# Patient Record
Sex: Male | Born: 1960 | Race: White | Hispanic: No | Marital: Married | Smoking: Current every day smoker
Health system: Southern US, Community
[De-identification: ages and names within clinical notes are randomized; demographics above are authoritative.]

## PROBLEM LIST (undated history)

## (undated) DIAGNOSIS — E785 Hyperlipidemia, unspecified: Secondary | ICD-10-CM

## (undated) DIAGNOSIS — H269 Unspecified cataract: Secondary | ICD-10-CM

## (undated) DIAGNOSIS — R519 Headache, unspecified: Secondary | ICD-10-CM

## (undated) DIAGNOSIS — G4730 Sleep apnea, unspecified: Secondary | ICD-10-CM

## (undated) DIAGNOSIS — F419 Anxiety disorder, unspecified: Secondary | ICD-10-CM

## (undated) DIAGNOSIS — K5790 Diverticulosis of intestine, part unspecified, without perforation or abscess without bleeding: Secondary | ICD-10-CM

## (undated) DIAGNOSIS — M1990 Unspecified osteoarthritis, unspecified site: Secondary | ICD-10-CM

## (undated) DIAGNOSIS — D126 Benign neoplasm of colon, unspecified: Secondary | ICD-10-CM

## (undated) HISTORY — PX: OTHER SURGICAL HISTORY: SHX169

## (undated) HISTORY — DX: Unspecified osteoarthritis, unspecified site: M19.90

## (undated) HISTORY — DX: Hyperlipidemia, unspecified: E78.5

## (undated) HISTORY — DX: Sleep apnea, unspecified: G47.30

## (undated) HISTORY — PX: JOINT REPLACEMENT: SHX530

## (undated) HISTORY — PX: WISDOM TOOTH EXTRACTION: SHX21

## (undated) HISTORY — PX: TYMPANOSTOMY: SHX2586

## (undated) HISTORY — PX: CATARACT EXTRACTION: SUR2

## (undated) HISTORY — PX: KNEE ARTHROSCOPY: SUR90

## (undated) HISTORY — PX: COLONOSCOPY: SHX174

## (undated) HISTORY — PX: SALIVARY GLAND SURGERY: SHX768

## (undated) HISTORY — DX: Benign neoplasm of colon, unspecified: D12.6

## (undated) HISTORY — PX: ADENOIDECTOMY: SUR15

## (undated) HISTORY — PX: NASAL SEPTUM SURGERY: SHX37

## (undated) HISTORY — DX: Diverticulosis of intestine, part unspecified, without perforation or abscess without bleeding: K57.90

## (undated) HISTORY — DX: Headache, unspecified: R51.9

## (undated) HISTORY — DX: Unspecified cataract: H26.9

## (undated) HISTORY — PX: MOUTH SURGERY: SHX715

## (undated) HISTORY — PX: ANKLE ARTHROSCOPY: SUR85

## (undated) HISTORY — PX: TONSILLECTOMY: SUR1361

---

## 1998-11-08 ENCOUNTER — Emergency Department (HOSPITAL_COMMUNITY): Admission: EM | Admit: 1998-11-08 | Discharge: 1998-11-09 | Payer: Self-pay | Admitting: Emergency Medicine

## 1999-01-02 ENCOUNTER — Encounter: Payer: Self-pay | Admitting: Surgery

## 1999-01-02 ENCOUNTER — Inpatient Hospital Stay (HOSPITAL_COMMUNITY): Admission: EM | Admit: 1999-01-02 | Discharge: 1999-01-06 | Payer: Self-pay | Admitting: *Deleted

## 1999-01-04 ENCOUNTER — Encounter: Payer: Self-pay | Admitting: Surgery

## 1999-01-31 ENCOUNTER — Encounter: Payer: Self-pay | Admitting: Surgery

## 1999-01-31 ENCOUNTER — Ambulatory Visit (HOSPITAL_COMMUNITY): Admission: RE | Admit: 1999-01-31 | Discharge: 1999-01-31 | Payer: Self-pay | Admitting: Surgery

## 2002-10-18 ENCOUNTER — Emergency Department (HOSPITAL_COMMUNITY): Admission: EM | Admit: 2002-10-18 | Discharge: 2002-10-18 | Payer: Self-pay | Admitting: Emergency Medicine

## 2002-12-10 ENCOUNTER — Encounter: Admission: RE | Admit: 2002-12-10 | Discharge: 2002-12-10 | Payer: Self-pay | Admitting: Internal Medicine

## 2002-12-30 ENCOUNTER — Encounter: Admission: RE | Admit: 2002-12-30 | Discharge: 2002-12-30 | Payer: Self-pay | Admitting: Internal Medicine

## 2003-03-10 ENCOUNTER — Encounter: Admission: RE | Admit: 2003-03-10 | Discharge: 2003-06-08 | Payer: Self-pay | Admitting: Family Medicine

## 2003-05-11 ENCOUNTER — Other Ambulatory Visit (HOSPITAL_COMMUNITY): Admission: RE | Admit: 2003-05-11 | Discharge: 2003-05-24 | Payer: Self-pay | Admitting: Psychiatry

## 2003-06-10 ENCOUNTER — Inpatient Hospital Stay (HOSPITAL_COMMUNITY): Admission: EM | Admit: 2003-06-10 | Discharge: 2003-06-15 | Payer: Self-pay | Admitting: Psychiatry

## 2009-05-01 ENCOUNTER — Inpatient Hospital Stay (HOSPITAL_COMMUNITY): Admission: EM | Admit: 2009-05-01 | Discharge: 2009-05-01 | Payer: Self-pay | Admitting: Emergency Medicine

## 2009-05-08 ENCOUNTER — Emergency Department (HOSPITAL_COMMUNITY): Admission: EM | Admit: 2009-05-08 | Discharge: 2009-05-08 | Payer: Self-pay | Admitting: Emergency Medicine

## 2010-09-13 LAB — POCT I-STAT, CHEM 8
BUN: 9 mg/dL (ref 6–23)
Calcium, Ion: 1.13 mmol/L (ref 1.12–1.32)
Chloride: 104 mEq/L (ref 96–112)
Creatinine, Ser: 0.9 mg/dL (ref 0.4–1.5)
Glucose, Bld: 109 mg/dL — ABNORMAL HIGH (ref 70–99)
HCT: 46 % (ref 39.0–52.0)
Hemoglobin: 15.6 g/dL (ref 13.0–17.0)
Potassium: 3.6 mEq/L (ref 3.5–5.1)
Sodium: 134 mEq/L — ABNORMAL LOW (ref 135–145)
TCO2: 26 mmol/L (ref 0–100)

## 2010-09-13 LAB — CBC
HCT: 38.7 % — ABNORMAL LOW (ref 39.0–52.0)
HCT: 43 % (ref 39.0–52.0)
Hemoglobin: 13.4 g/dL (ref 13.0–17.0)
Hemoglobin: 14.6 g/dL (ref 13.0–17.0)
MCHC: 34 g/dL (ref 30.0–36.0)
MCHC: 34.7 g/dL (ref 30.0–36.0)
MCV: 90.9 fL (ref 78.0–100.0)
MCV: 92.4 fL (ref 78.0–100.0)
Platelets: 134 10*3/uL — ABNORMAL LOW (ref 150–400)
Platelets: 176 10*3/uL (ref 150–400)
RBC: 4.25 MIL/uL (ref 4.22–5.81)
RBC: 4.65 MIL/uL (ref 4.22–5.81)
RDW: 13.2 % (ref 11.5–15.5)
RDW: 13.3 % (ref 11.5–15.5)
WBC: 14.6 10*3/uL — ABNORMAL HIGH (ref 4.0–10.5)
WBC: 7.6 10*3/uL (ref 4.0–10.5)

## 2010-09-13 LAB — DIFFERENTIAL
Basophils Absolute: 0 10*3/uL (ref 0.0–0.1)
Basophils Absolute: 0 10*3/uL (ref 0.0–0.1)
Basophils Relative: 0 % (ref 0–1)
Basophils Relative: 0 % (ref 0–1)
Eosinophils Absolute: 0.2 10*3/uL (ref 0.0–0.7)
Eosinophils Absolute: 0.3 10*3/uL (ref 0.0–0.7)
Eosinophils Relative: 1 % (ref 0–5)
Eosinophils Relative: 4 % (ref 0–5)
Lymphocytes Relative: 12 % (ref 12–46)
Lymphocytes Relative: 29 % (ref 12–46)
Lymphs Abs: 1.7 10*3/uL (ref 0.7–4.0)
Lymphs Abs: 2.2 10*3/uL (ref 0.7–4.0)
Monocytes Absolute: 0.8 10*3/uL (ref 0.1–1.0)
Monocytes Absolute: 0.8 10*3/uL (ref 0.1–1.0)
Monocytes Relative: 10 % (ref 3–12)
Monocytes Relative: 5 % (ref 3–12)
Neutro Abs: 11.9 10*3/uL — ABNORMAL HIGH (ref 1.7–7.7)
Neutro Abs: 4.2 10*3/uL (ref 1.7–7.7)
Neutrophils Relative %: 55 % (ref 43–77)
Neutrophils Relative %: 82 % — ABNORMAL HIGH (ref 43–77)
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
## 2014-11-01 ENCOUNTER — Other Ambulatory Visit: Payer: Self-pay | Admitting: Family Medicine

## 2014-11-01 DIAGNOSIS — R7989 Other specified abnormal findings of blood chemistry: Secondary | ICD-10-CM

## 2014-11-01 DIAGNOSIS — M5412 Radiculopathy, cervical region: Secondary | ICD-10-CM

## 2014-11-03 ENCOUNTER — Ambulatory Visit
Admission: RE | Admit: 2014-11-03 | Discharge: 2014-11-03 | Disposition: A | Discharge status: Home / Self Care | Payer: 59 | Source: Ambulatory Visit | Attending: Family Medicine | Admitting: Family Medicine

## 2014-11-03 DIAGNOSIS — R7989 Other specified abnormal findings of blood chemistry: Secondary | ICD-10-CM

## 2014-11-03 DIAGNOSIS — M5412 Radiculopathy, cervical region: Secondary | ICD-10-CM

## 2014-11-09 ENCOUNTER — Other Ambulatory Visit: Payer: Self-pay | Admitting: Orthopedic Surgery

## 2014-11-09 DIAGNOSIS — M25511 Pain in right shoulder: Secondary | ICD-10-CM

## 2014-11-22 ENCOUNTER — Other Ambulatory Visit: Payer: 59

## 2015-03-08 ENCOUNTER — Other Ambulatory Visit: Payer: Self-pay | Admitting: Neurosurgery

## 2015-03-08 DIAGNOSIS — M5412 Radiculopathy, cervical region: Secondary | ICD-10-CM

## 2015-03-21 ENCOUNTER — Ambulatory Visit
Admission: RE | Admit: 2015-03-21 | Discharge: 2015-03-21 | Disposition: A | Discharge status: Home / Self Care | Payer: 59 | Source: Ambulatory Visit | Attending: Neurosurgery | Admitting: Neurosurgery

## 2015-03-21 DIAGNOSIS — M5412 Radiculopathy, cervical region: Secondary | ICD-10-CM

## 2015-03-21 MED ORDER — IOHEXOL 300 MG/ML  SOLN
10.0000 mL | Freq: Once | INTRAMUSCULAR | Status: DC | PRN
  Administered 2015-03-21: 10 mL via INTRATHECAL

## 2015-03-21 MED ORDER — DIAZEPAM 5 MG PO TABS
10.0000 mg | ORAL_TABLET | Freq: Once | ORAL | Status: AC
  Administered 2015-03-21: 10 mg via ORAL

## 2015-03-21 NOTE — Discharge Instructions (Signed)

## 2015-03-21 NOTE — Progress Notes (Signed)
Patient states he has been off Lexapro for at least the past two days.  Vannessa Godown, RN 

## 2015-04-11 ENCOUNTER — Other Ambulatory Visit: Payer: Self-pay | Admitting: Neurosurgery

## 2015-04-11 DIAGNOSIS — M5412 Radiculopathy, cervical region: Secondary | ICD-10-CM

## 2015-04-18 ENCOUNTER — Ambulatory Visit
Admission: RE | Admit: 2015-04-18 | Discharge: 2015-04-18 | Disposition: A | Discharge status: Home / Self Care | Payer: 59 | Source: Ambulatory Visit | Attending: Neurosurgery | Admitting: Neurosurgery

## 2015-04-18 DIAGNOSIS — M5412 Radiculopathy, cervical region: Secondary | ICD-10-CM

## 2015-04-18 MED ORDER — TRIAMCINOLONE ACETONIDE 40 MG/ML IJ SUSP (RADIOLOGY)
60.0000 mg | Freq: Once | INTRAMUSCULAR | Status: AC
  Administered 2015-04-18: 60 mg via EPIDURAL

## 2015-04-18 MED ORDER — IOHEXOL 300 MG/ML  SOLN
1.0000 mL | Freq: Once | INTRAMUSCULAR | Status: DC | PRN
  Administered 2015-04-18: 1 mL via EPIDURAL

## 2015-04-18 NOTE — Discharge Instructions (Signed)

## 2015-04-27 ENCOUNTER — Other Ambulatory Visit (HOSPITAL_COMMUNITY): Payer: Self-pay | Admitting: Neurosurgery

## 2015-05-10 ENCOUNTER — Encounter: Payer: Self-pay | Admitting: Internal Medicine

## 2015-06-09 ENCOUNTER — Ambulatory Visit: Admit: 2015-06-09 | Payer: Self-pay | Admitting: Neurosurgery

## 2015-06-09 SURGERY — ANTERIOR CERVICAL DECOMPRESSION/DISCECTOMY FUSION 1 LEVEL
Anesthesia: General

## 2015-07-01 ENCOUNTER — Ambulatory Visit (AMBULATORY_SURGERY_CENTER): Payer: Self-pay | Admitting: *Deleted

## 2015-07-01 VITALS — Ht 66.5 in | Wt 222.6 lb

## 2015-07-01 DIAGNOSIS — Z1211 Encounter for screening for malignant neoplasm of colon: Secondary | ICD-10-CM

## 2015-07-01 NOTE — Progress Notes (Signed)
No egg or soy allergy known to patient  No issues with past sedation with any surgeries  or procedures, no intubation problems  No diet pills per patient  No home 02 use per patient   Pt declined emmi

## 2015-07-15 ENCOUNTER — Encounter: Payer: Self-pay | Admitting: Internal Medicine

## 2015-07-15 ENCOUNTER — Ambulatory Visit (AMBULATORY_SURGERY_CENTER): Payer: BLUE CROSS/BLUE SHIELD | Admitting: Internal Medicine

## 2015-07-15 VITALS — BP 146/95 | HR 72 | Temp 96.1°F | Resp 18 | Ht 66.0 in | Wt 222.0 lb

## 2015-07-15 DIAGNOSIS — D123 Benign neoplasm of transverse colon: Secondary | ICD-10-CM | POA: Diagnosis not present

## 2015-07-15 DIAGNOSIS — D125 Benign neoplasm of sigmoid colon: Secondary | ICD-10-CM

## 2015-07-15 DIAGNOSIS — K635 Polyp of colon: Secondary | ICD-10-CM

## 2015-07-15 DIAGNOSIS — D124 Benign neoplasm of descending colon: Secondary | ICD-10-CM

## 2015-07-15 DIAGNOSIS — Z1211 Encounter for screening for malignant neoplasm of colon: Secondary | ICD-10-CM

## 2015-07-15 DIAGNOSIS — D127 Benign neoplasm of rectosigmoid junction: Secondary | ICD-10-CM

## 2015-07-15 MED ORDER — SODIUM CHLORIDE 0.9 % IV SOLN: 500.0000 mL | INTRAVENOUS | Status: DC

## 2015-07-15 NOTE — Patient Instructions (Signed)

## 2015-07-15 NOTE — Progress Notes (Signed)
To recovery, report to Scott, RN, VSS 

## 2015-07-15 NOTE — Op Note (Signed)
[facility]  [address] [place], [address]   COLONOSCOPY PROCEDURE REPORT  PATIENT: Derald, Torti  MR#: [id number] BIRTHDATE: 04-Nov-1960 , 88  yrs. old GENDER: male ENDOSCOPIST: Jerene Bears, MD REFERRED [id number] Jacelyn Grip, M.D. PROCEDURE DATE:  07/15/2015 PROCEDURE:   Colonoscopy, screening and Colonoscopy with snare polypectomy First Screening Colonoscopy - Avg.  risk and is 50 yrs.  old or older Yes.  Prior Negative Screening - Now for repeat screening. N/A  History of Adenoma - Now for follow-up colonoscopy & has been > or = to 3 yrs.  N/A  Polyps removed today? Yes ASA CLASS:   Class II INDICATIONS:Screening for colonic neoplasia and Colorectal Neoplasm Risk Assessment for this procedure is average risk. MEDICATIONS: Monitored anesthesia care and Propofol 300 mg IV  DESCRIPTION OF PROCEDURE:   After the risks benefits and alternatives of the procedure were thoroughly explained, informed consent was obtained.  The digital rectal exam revealed no rectal mass.   The LB [id number]  endoscope was introduced through the anus and advanced to the cecum, which was identified by both the appendix and ileocecal valve. No adverse events experienced. The quality of the prep was good.  (MiraLax was used)  The instrument was then slowly withdrawn as the colon was fully examined. Estimated blood loss is zero unless otherwise noted in this procedure report.      COLON FINDINGS: Three sessile polyps ranging between 3-34mm in size were found in the transverse colon and descending colon. Polypectomies were performed with a cold snare.  The resection was complete, the polyp tissue was completely retrieved and sent to histology.   Three sessile polyps ranging between 5-42mm in size were found in the sigmoid colon and rectosigmoid colon. Polypectomies were performed with a cold snare and using snare cautery (2).  The resection was complete, the polyp tissue  was completely retrieved and sent to histology.   There was mild diverticulosis noted in the left colon.  Retroflexed views revealed no abnormalities. The time to cecum = 6.0 Withdrawal time = 19.0 The scope was withdrawn and the procedure completed. COMPLICATIONS: There were no immediate complications.  ENDOSCOPIC IMPRESSION: 1.   Three sessile polyps ranging between 3-44mm in size were found in the transverse colon and descending colon; polypectomies were performed with a cold snare 2.   Three sessile polyps ranging between 5-3mm in size were found in the sigmoid colon and rectosigmoid colon; polypectomies were performed with a cold snare and using snare cautery 3.   Mild diverticulosis was noted in the left colon  RECOMMENDATIONS: 1.  Avoid all NSAIDs for the next 2 weeks. 2.  Await pathology results 3.  If the polyps removed today are proven to be adenomatous (pre-cancerous) polyps, you will need a colonoscopy in 3 years. Otherwise you should continue to follow colorectal cancer screening guidelines for "routine risk" patients with a colonoscopy in 10 years.  You will receive a letter within 1-2 weeks with the results of your biopsy as well as final recommendations.  Please call my office if you have not received a letter after 3 weeks.  eSigned:  Jerene Bears, MD 07/15/2015 11:30 AM   cc:  the patient, Dr. Jacelyn Grip   PATIENT NAME:  Musaab, Geddis MR#: [id number]

## 2015-07-15 NOTE — Progress Notes (Signed)
Called to room to assist during endoscopic procedure.  Patient ID and intended procedure confirmed with present staff. Received instructions for my participation in the procedure from the performing physician.  

## 2015-07-18 ENCOUNTER — Telehealth: Payer: Self-pay

## 2015-07-18 NOTE — Telephone Encounter (Signed)
  Follow up Call-  Call back number 07/15/2015  Post procedure Call Back phone  # [phone]  Permission to leave phone message Yes    Patient questions:  Do you have a fever, pain , or abdominal swelling? No. Pain Score  2 *  Have you tolerated food without any problems? Yes.    Have you been able to return to your normal activities? Yes.    Do you have any questions about your discharge instructions: Diet   No. Medications  No. Follow up visit  No.  Do you have questions or concerns about your Care? No.  Actions: * If pain score is 4 or above: No action needed, pain <4.

## 2015-07-19 ENCOUNTER — Encounter: Payer: Self-pay | Admitting: Internal Medicine

## 2015-07-27 ENCOUNTER — Telehealth: Payer: Self-pay | Admitting: Internal Medicine

## 2015-07-27 NOTE — Telephone Encounter (Signed)
Left message for pt to call back.  Path letter reviewed with pt and questions were answered.

## 2015-08-11 ENCOUNTER — Other Ambulatory Visit: Payer: Self-pay | Admitting: Neurosurgery

## 2015-08-11 DIAGNOSIS — M5412 Radiculopathy, cervical region: Secondary | ICD-10-CM

## 2015-09-01 ENCOUNTER — Other Ambulatory Visit: Payer: BLUE CROSS/BLUE SHIELD

## 2015-09-28 DIAGNOSIS — R400 Somnolence: Secondary | ICD-10-CM | POA: Diagnosis not present

## 2015-09-28 DIAGNOSIS — R0681 Apnea, not elsewhere classified: Secondary | ICD-10-CM | POA: Diagnosis not present

## 2015-10-04 DIAGNOSIS — G4730 Sleep apnea, unspecified: Secondary | ICD-10-CM | POA: Diagnosis not present

## 2015-10-12 DIAGNOSIS — G4733 Obstructive sleep apnea (adult) (pediatric): Secondary | ICD-10-CM | POA: Diagnosis not present

## 2015-10-12 DIAGNOSIS — J342 Deviated nasal septum: Secondary | ICD-10-CM | POA: Diagnosis not present

## 2015-10-12 DIAGNOSIS — J343 Hypertrophy of nasal turbinates: Secondary | ICD-10-CM | POA: Diagnosis not present

## 2015-10-17 DIAGNOSIS — G4733 Obstructive sleep apnea (adult) (pediatric): Secondary | ICD-10-CM | POA: Diagnosis not present

## 2015-11-15 DIAGNOSIS — J029 Acute pharyngitis, unspecified: Secondary | ICD-10-CM | POA: Diagnosis not present

## 2015-11-15 DIAGNOSIS — R0982 Postnasal drip: Secondary | ICD-10-CM | POA: Diagnosis not present

## 2015-11-17 DIAGNOSIS — G4733 Obstructive sleep apnea (adult) (pediatric): Secondary | ICD-10-CM | POA: Diagnosis not present

## 2015-11-28 DIAGNOSIS — L03031 Cellulitis of right toe: Secondary | ICD-10-CM | POA: Diagnosis not present

## 2015-11-28 DIAGNOSIS — B351 Tinea unguium: Secondary | ICD-10-CM | POA: Diagnosis not present

## 2015-11-28 DIAGNOSIS — S92525A Nondisplaced fracture of medial phalanx of left lesser toe(s), initial encounter for closed fracture: Secondary | ICD-10-CM | POA: Diagnosis not present

## 2015-11-28 DIAGNOSIS — M25571 Pain in right ankle and joints of right foot: Secondary | ICD-10-CM | POA: Diagnosis not present

## 2015-12-07 DIAGNOSIS — G4733 Obstructive sleep apnea (adult) (pediatric): Secondary | ICD-10-CM | POA: Diagnosis not present

## 2015-12-09 DIAGNOSIS — T8189XA Other complications of procedures, not elsewhere classified, initial encounter: Secondary | ICD-10-CM | POA: Diagnosis not present

## 2015-12-09 DIAGNOSIS — S92525A Nondisplaced fracture of medial phalanx of left lesser toe(s), initial encounter for closed fracture: Secondary | ICD-10-CM | POA: Diagnosis not present

## 2015-12-09 DIAGNOSIS — L03031 Cellulitis of right toe: Secondary | ICD-10-CM | POA: Diagnosis not present

## 2015-12-15 DIAGNOSIS — T8189XS Other complications of procedures, not elsewhere classified, sequela: Secondary | ICD-10-CM | POA: Diagnosis not present

## 2015-12-17 DIAGNOSIS — G4733 Obstructive sleep apnea (adult) (pediatric): Secondary | ICD-10-CM | POA: Diagnosis not present

## 2015-12-19 DIAGNOSIS — M542 Cervicalgia: Secondary | ICD-10-CM | POA: Diagnosis not present

## 2015-12-19 DIAGNOSIS — R972 Elevated prostate specific antigen [PSA]: Secondary | ICD-10-CM | POA: Diagnosis not present

## 2015-12-19 DIAGNOSIS — E559 Vitamin D deficiency, unspecified: Secondary | ICD-10-CM | POA: Diagnosis not present

## 2015-12-19 DIAGNOSIS — E291 Testicular hypofunction: Secondary | ICD-10-CM | POA: Diagnosis not present

## 2015-12-29 DIAGNOSIS — R591 Generalized enlarged lymph nodes: Secondary | ICD-10-CM | POA: Diagnosis not present

## 2016-01-03 DIAGNOSIS — J029 Acute pharyngitis, unspecified: Secondary | ICD-10-CM | POA: Diagnosis not present

## 2016-01-03 DIAGNOSIS — R591 Generalized enlarged lymph nodes: Secondary | ICD-10-CM | POA: Diagnosis not present

## 2016-01-05 ENCOUNTER — Other Ambulatory Visit: Payer: Self-pay | Admitting: Otolaryngology

## 2016-01-05 DIAGNOSIS — R221 Localized swelling, mass and lump, neck: Secondary | ICD-10-CM

## 2016-01-06 ENCOUNTER — Ambulatory Visit
Admission: RE | Admit: 2016-01-06 | Discharge: 2016-01-06 | Disposition: A | Discharge status: Home / Self Care | Payer: BLUE CROSS/BLUE SHIELD | Source: Ambulatory Visit | Attending: Otolaryngology | Admitting: Otolaryngology

## 2016-01-06 DIAGNOSIS — R221 Localized swelling, mass and lump, neck: Secondary | ICD-10-CM | POA: Diagnosis not present

## 2016-01-06 MED ORDER — IOPAMIDOL (ISOVUE-300) INJECTION 61%
75.0000 mL | Freq: Once | INTRAVENOUS | Status: AC | PRN
Start: 2016-01-06 — End: 2016-01-06
  Administered 2016-01-06: 75 mL via INTRAVENOUS

## 2016-01-16 DIAGNOSIS — R221 Localized swelling, mass and lump, neck: Secondary | ICD-10-CM | POA: Diagnosis not present

## 2016-01-16 DIAGNOSIS — R591 Generalized enlarged lymph nodes: Secondary | ICD-10-CM | POA: Diagnosis not present

## 2016-01-17 DIAGNOSIS — G4733 Obstructive sleep apnea (adult) (pediatric): Secondary | ICD-10-CM | POA: Diagnosis not present

## 2016-01-26 DIAGNOSIS — R221 Localized swelling, mass and lump, neck: Secondary | ICD-10-CM | POA: Diagnosis not present

## 2016-02-01 ENCOUNTER — Other Ambulatory Visit: Payer: Self-pay | Admitting: Otolaryngology

## 2016-02-01 DIAGNOSIS — F411 Generalized anxiety disorder: Secondary | ICD-10-CM | POA: Diagnosis not present

## 2016-02-01 DIAGNOSIS — R221 Localized swelling, mass and lump, neck: Secondary | ICD-10-CM

## 2016-02-01 DIAGNOSIS — R0981 Nasal congestion: Secondary | ICD-10-CM | POA: Diagnosis not present

## 2016-02-01 DIAGNOSIS — J029 Acute pharyngitis, unspecified: Secondary | ICD-10-CM | POA: Diagnosis not present

## 2016-02-07 ENCOUNTER — Ambulatory Visit
Admission: RE | Admit: 2016-02-07 | Discharge: 2016-02-07 | Disposition: A | Discharge status: Home / Self Care | Payer: BLUE CROSS/BLUE SHIELD | Source: Ambulatory Visit | Attending: Otolaryngology | Admitting: Otolaryngology

## 2016-02-07 DIAGNOSIS — R221 Localized swelling, mass and lump, neck: Secondary | ICD-10-CM

## 2016-02-07 DIAGNOSIS — R5383 Other fatigue: Secondary | ICD-10-CM | POA: Diagnosis not present

## 2016-02-09 DIAGNOSIS — R591 Generalized enlarged lymph nodes: Secondary | ICD-10-CM | POA: Diagnosis not present

## 2016-02-29 DIAGNOSIS — Z881 Allergy status to other antibiotic agents status: Secondary | ICD-10-CM | POA: Diagnosis not present

## 2016-02-29 DIAGNOSIS — J029 Acute pharyngitis, unspecified: Secondary | ICD-10-CM | POA: Diagnosis not present

## 2016-02-29 DIAGNOSIS — Z87891 Personal history of nicotine dependence: Secondary | ICD-10-CM | POA: Diagnosis not present

## 2016-02-29 DIAGNOSIS — R591 Generalized enlarged lymph nodes: Secondary | ICD-10-CM | POA: Diagnosis not present

## 2016-03-05 DIAGNOSIS — M542 Cervicalgia: Secondary | ICD-10-CM | POA: Diagnosis not present

## 2016-03-05 DIAGNOSIS — R0981 Nasal congestion: Secondary | ICD-10-CM | POA: Diagnosis not present

## 2016-03-05 DIAGNOSIS — Z23 Encounter for immunization: Secondary | ICD-10-CM | POA: Diagnosis not present

## 2016-03-07 DIAGNOSIS — R591 Generalized enlarged lymph nodes: Secondary | ICD-10-CM | POA: Diagnosis not present

## 2016-03-12 DIAGNOSIS — Z111 Encounter for screening for respiratory tuberculosis: Secondary | ICD-10-CM | POA: Diagnosis not present

## 2016-03-22 DIAGNOSIS — R590 Localized enlarged lymph nodes: Secondary | ICD-10-CM | POA: Diagnosis not present

## 2016-03-22 DIAGNOSIS — R221 Localized swelling, mass and lump, neck: Secondary | ICD-10-CM | POA: Diagnosis not present

## 2016-03-22 DIAGNOSIS — Z881 Allergy status to other antibiotic agents status: Secondary | ICD-10-CM | POA: Diagnosis not present

## 2016-03-22 DIAGNOSIS — Z87891 Personal history of nicotine dependence: Secondary | ICD-10-CM | POA: Diagnosis not present

## 2016-03-26 DIAGNOSIS — N5201 Erectile dysfunction due to arterial insufficiency: Secondary | ICD-10-CM | POA: Diagnosis not present

## 2016-03-26 DIAGNOSIS — R972 Elevated prostate specific antigen [PSA]: Secondary | ICD-10-CM | POA: Diagnosis not present

## 2016-03-26 DIAGNOSIS — E291 Testicular hypofunction: Secondary | ICD-10-CM | POA: Diagnosis not present

## 2016-04-06 DIAGNOSIS — G4733 Obstructive sleep apnea (adult) (pediatric): Secondary | ICD-10-CM | POA: Diagnosis not present

## 2016-04-06 DIAGNOSIS — R590 Localized enlarged lymph nodes: Secondary | ICD-10-CM | POA: Diagnosis not present

## 2016-04-06 DIAGNOSIS — R634 Abnormal weight loss: Secondary | ICD-10-CM | POA: Diagnosis not present

## 2016-04-06 DIAGNOSIS — F418 Other specified anxiety disorders: Secondary | ICD-10-CM | POA: Diagnosis not present

## 2016-04-09 DIAGNOSIS — G4733 Obstructive sleep apnea (adult) (pediatric): Secondary | ICD-10-CM | POA: Diagnosis not present

## 2016-04-09 DIAGNOSIS — R590 Localized enlarged lymph nodes: Secondary | ICD-10-CM | POA: Diagnosis not present

## 2016-04-09 DIAGNOSIS — R634 Abnormal weight loss: Secondary | ICD-10-CM | POA: Diagnosis not present

## 2016-04-09 DIAGNOSIS — F418 Other specified anxiety disorders: Secondary | ICD-10-CM | POA: Diagnosis not present

## 2016-04-17 DIAGNOSIS — R899 Unspecified abnormal finding in specimens from other organs, systems and tissues: Secondary | ICD-10-CM | POA: Diagnosis not present

## 2016-04-17 DIAGNOSIS — R030 Elevated blood-pressure reading, without diagnosis of hypertension: Secondary | ICD-10-CM | POA: Diagnosis not present

## 2016-06-08 DIAGNOSIS — J0100 Acute maxillary sinusitis, unspecified: Secondary | ICD-10-CM | POA: Diagnosis not present

## 2016-06-13 DIAGNOSIS — G4733 Obstructive sleep apnea (adult) (pediatric): Secondary | ICD-10-CM | POA: Diagnosis not present

## 2016-06-14 DIAGNOSIS — M545 Low back pain: Secondary | ICD-10-CM | POA: Diagnosis not present

## 2016-06-14 DIAGNOSIS — H6980 Other specified disorders of Eustachian tube, unspecified ear: Secondary | ICD-10-CM | POA: Diagnosis not present

## 2016-06-14 DIAGNOSIS — F0781 Postconcussional syndrome: Secondary | ICD-10-CM | POA: Diagnosis not present

## 2016-06-14 DIAGNOSIS — M5412 Radiculopathy, cervical region: Secondary | ICD-10-CM | POA: Diagnosis not present

## 2016-07-19 DIAGNOSIS — M9903 Segmental and somatic dysfunction of lumbar region: Secondary | ICD-10-CM | POA: Diagnosis not present

## 2016-07-19 DIAGNOSIS — M50322 Other cervical disc degeneration at C5-C6 level: Secondary | ICD-10-CM | POA: Diagnosis not present

## 2016-07-19 DIAGNOSIS — M5432 Sciatica, left side: Secondary | ICD-10-CM | POA: Diagnosis not present

## 2016-07-19 DIAGNOSIS — M5137 Other intervertebral disc degeneration, lumbosacral region: Secondary | ICD-10-CM | POA: Diagnosis not present

## 2016-07-23 DIAGNOSIS — M5432 Sciatica, left side: Secondary | ICD-10-CM | POA: Diagnosis not present

## 2016-07-23 DIAGNOSIS — M50322 Other cervical disc degeneration at C5-C6 level: Secondary | ICD-10-CM | POA: Diagnosis not present

## 2016-07-23 DIAGNOSIS — M5137 Other intervertebral disc degeneration, lumbosacral region: Secondary | ICD-10-CM | POA: Diagnosis not present

## 2016-07-23 DIAGNOSIS — M9903 Segmental and somatic dysfunction of lumbar region: Secondary | ICD-10-CM | POA: Diagnosis not present

## 2016-08-03 DIAGNOSIS — M545 Low back pain: Secondary | ICD-10-CM | POA: Diagnosis not present

## 2016-08-03 DIAGNOSIS — F411 Generalized anxiety disorder: Secondary | ICD-10-CM | POA: Diagnosis not present

## 2016-08-15 DIAGNOSIS — R591 Generalized enlarged lymph nodes: Secondary | ICD-10-CM | POA: Diagnosis not present

## 2016-08-25 DIAGNOSIS — N390 Urinary tract infection, site not specified: Secondary | ICD-10-CM | POA: Diagnosis not present

## 2016-08-25 DIAGNOSIS — L0390 Cellulitis, unspecified: Secondary | ICD-10-CM | POA: Diagnosis not present

## 2016-08-28 DIAGNOSIS — R8290 Unspecified abnormal findings in urine: Secondary | ICD-10-CM | POA: Diagnosis not present

## 2016-08-28 DIAGNOSIS — L02214 Cutaneous abscess of groin: Secondary | ICD-10-CM | POA: Diagnosis not present

## 2016-09-21 DIAGNOSIS — E559 Vitamin D deficiency, unspecified: Secondary | ICD-10-CM | POA: Diagnosis not present

## 2016-09-21 DIAGNOSIS — R8290 Unspecified abnormal findings in urine: Secondary | ICD-10-CM | POA: Diagnosis not present

## 2016-09-21 DIAGNOSIS — E291 Testicular hypofunction: Secondary | ICD-10-CM | POA: Diagnosis not present

## 2016-09-21 DIAGNOSIS — E785 Hyperlipidemia, unspecified: Secondary | ICD-10-CM | POA: Diagnosis not present

## 2016-09-25 DIAGNOSIS — F419 Anxiety disorder, unspecified: Secondary | ICD-10-CM | POA: Diagnosis not present

## 2016-09-25 DIAGNOSIS — Z0000 Encounter for general adult medical examination without abnormal findings: Secondary | ICD-10-CM | POA: Diagnosis not present

## 2016-09-25 DIAGNOSIS — M542 Cervicalgia: Secondary | ICD-10-CM | POA: Diagnosis not present

## 2016-09-25 DIAGNOSIS — N529 Male erectile dysfunction, unspecified: Secondary | ICD-10-CM | POA: Diagnosis not present

## 2016-10-16 ENCOUNTER — Emergency Department (HOSPITAL_BASED_OUTPATIENT_CLINIC_OR_DEPARTMENT_OTHER)
Admission: EM | Admit: 2016-10-16 | Discharge: 2016-10-16 | Disposition: A | Discharge status: Home / Self Care | Payer: BLUE CROSS/BLUE SHIELD | Attending: Emergency Medicine | Admitting: Emergency Medicine

## 2016-10-16 ENCOUNTER — Encounter (HOSPITAL_BASED_OUTPATIENT_CLINIC_OR_DEPARTMENT_OTHER): Payer: Self-pay | Admitting: Emergency Medicine

## 2016-10-16 DIAGNOSIS — Z91038 Other insect allergy status: Secondary | ICD-10-CM

## 2016-10-16 DIAGNOSIS — F17200 Nicotine dependence, unspecified, uncomplicated: Secondary | ICD-10-CM | POA: Diagnosis not present

## 2016-10-16 DIAGNOSIS — L03114 Cellulitis of left upper limb: Secondary | ICD-10-CM | POA: Diagnosis not present

## 2016-10-16 DIAGNOSIS — T63481A Toxic effect of venom of other arthropod, accidental (unintentional), initial encounter: Secondary | ICD-10-CM | POA: Insufficient documentation

## 2016-10-16 DIAGNOSIS — T6391XA Toxic effect of contact with unspecified venomous animal, accidental (unintentional), initial encounter: Secondary | ICD-10-CM | POA: Diagnosis not present

## 2016-10-16 HISTORY — DX: Anxiety disorder, unspecified: F41.9

## 2016-10-16 LAB — COMPREHENSIVE METABOLIC PANEL
ALT: 22 U/L (ref 17–63)
AST: 21 U/L (ref 15–41)
Albumin: 4.3 g/dL (ref 3.5–5.0)
Alkaline Phosphatase: 59 U/L (ref 38–126)
Anion gap: 9 (ref 5–15)
BUN: 11 mg/dL (ref 6–20)
CO2: 25 mmol/L (ref 22–32)
Calcium: 9.1 mg/dL (ref 8.9–10.3)
Chloride: 101 mmol/L (ref 101–111)
Creatinine, Ser: 0.93 mg/dL (ref 0.61–1.24)
GFR calc Af Amer: >60 mL/min (ref >60)
GFR calc non Af Amer: >60 mL/min (ref >60)
Glucose, Bld: 96 mg/dL (ref 65–99)
Potassium: 3.8 mmol/L (ref 3.5–5.1)
Sodium: 135 mmol/L (ref 135–145)
Total Bilirubin: 0.9 mg/dL (ref 0.3–1.2)
Total Protein: 7.4 g/dL (ref 6.5–8.1)

## 2016-10-16 LAB — CBC WITH DIFFERENTIAL/PLATELET
Basophils Absolute: 0 10*3/uL (ref 0.0–0.1)
Basophils Relative: 0 %
Eosinophils Absolute: 0.3 10*3/uL (ref 0.0–0.7)
Eosinophils Relative: 4 %
HCT: 43.1 % (ref 39.0–52.0)
Hemoglobin: 14.5 g/dL (ref 13.0–17.0)
Lymphocytes Relative: 14 %
Lymphs Abs: 1.3 10*3/uL (ref 0.7–4.0)
MCH: 31.3 pg (ref 26.0–34.0)
MCHC: 33.6 g/dL (ref 30.0–36.0)
MCV: 93.1 fL (ref 78.0–100.0)
Monocytes Absolute: 0.6 10*3/uL (ref 0.1–1.0)
Monocytes Relative: 6 %
Neutro Abs: 7.2 10*3/uL (ref 1.7–7.7)
Neutrophils Relative %: 76 %
Platelets: 131 10*3/uL — ABNORMAL LOW (ref 150–400)
RBC: 4.63 MIL/uL (ref 4.22–5.81)
RDW: 13.6 % (ref 11.5–15.5)
WBC: 9.4 10*3/uL (ref 4.0–10.5)

## 2016-10-16 MED ORDER — TRAMADOL HCL 50 MG PO TABS: 50.0000 mg | ORAL_TABLET | Freq: Four times a day (QID) | ORAL | 0 refills | Status: DC | PRN

## 2016-10-16 MED ORDER — IBUPROFEN 800 MG PO TABS
ORAL_TABLET | ORAL | Status: AC
  Administered 2016-10-16: 800 mg
  Filled 2016-10-16: qty 1

## 2016-10-16 MED ORDER — PREDNISONE 20 MG PO TABS: 40.0000 mg | ORAL_TABLET | Freq: Every day | ORAL | 0 refills | Status: AC

## 2016-10-16 NOTE — ED Triage Notes (Signed)
Patient reports that he woke up this am with left hand swelling. Reports that he went to his PMD - Patient has a area marked  - started on steroid and Antibiotics - however the swelling is getting worse

## 2016-10-16 NOTE — Discharge Instructions (Signed)
Steroids given to take starting tomorrow for a short course Can continue benadryl as needed for itching Continue antibiotics as prescribed Continue to keep hand iced and elevated Pain medication also prescribed Follow-up with PCP

## 2016-10-16 NOTE — ED Provider Notes (Signed)
[facility] Provider Note   CSN: [id number] Arrival date & time: 10/16/16  1503   History   Chief Complaint Chief Complaint  Patient presents with  . Cellulitis    HPI [name] is a 56 y.o. male.  HPI  Patient presents to ED with left hand swelling. Patient states that yesterday while he was taking the trash out something bit or stung him in the hand. He did not see what it was; denies any snakes. However after that he noticed that his hand started to swell. He took 2 Benadryl yesterday to help with the swelling and itching. Patient states that he woke up this morning due to pain and uncontrollable itching in his hand. He saw his primary care provider this morning who gave him a shot of steroids and prescription for antibiotics. Patient has taken one doxycycline. However, as the day progressed patient noticed that his hand was swelling more and becoming warm. Patient is right hand dominant.    Past Medical History:  Diagnosis Date  . Anxiety   . Cataract   . Sleep apnea    no cpap    There are no active problems to display for this patient.   Past Surgical History:  Procedure Laterality Date  . ANKLE ARTHROSCOPY Left    x2  . CATARACT EXTRACTION Bilateral   . KNEE ARTHROSCOPY Left    x2  . NASAL SEPTUM SURGERY    . right leg surgery     fx fib/tib with steel rod from injury   . SALIVARY GLAND SURGERY    . TONSILLECTOMY    . WISDOM TOOTH EXTRACTION       Home Medications    Prior to Admission medications   Medication Sig Start Date End Date Taking? Authorizing Provider  bisacodyl (DULCOLAX) 5 MG EC tablet Take 5 mg by mouth once.    [provider]  ibuprofen (ADVIL) 200 MG tablet Take 200 mg by mouth every 6 (six) hours as needed.    [provider]  LEXAPRO 20 MG tablet Take 20 mg by mouth daily.  04/07/15   [provider]  Multiple Vitamin (MULTIVITAMIN) tablet Take 1 tablet by mouth daily.    [provider]  Omega-3 Fatty Acids (FISH OIL CONCENTRATE PO) Take 2 capsules by mouth daily.    [provider]  OVER THE COUNTER MEDICATION Take 2 capsules by mouth daily. Vitamind    [provider]  OVER THE COUNTER MEDICATION Take 2 capsules by mouth daily. Red wine antioxidant daily    [provider]    Family History Family History  Problem Relation Age of Onset  . Cancer - Other Sister   . Cancer - Other Maternal Grandfather   . Heart disease Mother   . Diabetes Father   . Heart disease Father   . Diabetes Maternal Aunt   . Diabetes Maternal Grandmother   . Colon cancer Neg Hx   . Colon polyps Neg Hx   . Esophageal cancer Neg Hx   . Rectal cancer Neg Hx   . Stomach cancer Neg Hx     Social History Social History  Substance Use Topics  . Smoking status: Current Every Day Smoker  . Smokeless tobacco: Never Used  . Alcohol use No    Allergies   Keflex [cephalexin]   Review of Systems Review of Systems  Constitutional: Negative for chills, diaphoresis and fever.  Respiratory: Negative for shortness of breath.  Cardiovascular: Negative for chest pain.  Gastrointestinal: Negative for abdominal pain.  Skin: Positive for color change and wound.  Also per HPI  Physical Exam Updated Vital Signs BP (!) 143/70 (BP Location: Right Arm)   Pulse 83   Temp 98.3 F (36.8 C) (Oral)   Resp 18   Ht 5\' 7"  (1.702 m)   Wt 97.5 kg   SpO2 100%   BMI 33.67 kg/m   Physical Exam  Constitutional: He appears well-developed and well-nourished. No distress.  HENT:  Head: Normocephalic and atraumatic.  Mouth/Throat: Oropharynx is clear and moist.  Eyes: Conjunctivae and EOM are normal.  Neck: Normal range of motion. Neck supple.  Cardiovascular: Normal rate, regular rhythm, normal heart sounds and normal pulses.   Pulmonary/Chest: Effort normal and breath sounds normal.  Musculoskeletal:       Left hand: He exhibits decreased range of motion,  tenderness and swelling.  Puncture wound with surrounding bruise. Has swelling up to mid-forearm.   Neurological: He is alert. No cranial nerve deficit.  Skin: Skin is warm and dry. There is erythema.    ED Treatments / Results  Labs (all labs ordered are listed, but only abnormal results are displayed) Labs Reviewed  CBC WITH DIFFERENTIAL/PLATELET - Abnormal; Notable for the following:       Result Value   Platelets 131 (*)    All other components within normal limits  CULTURE, BLOOD (ROUTINE X 2)  CULTURE, BLOOD (ROUTINE X 2)  COMPREHENSIVE METABOLIC PANEL    EKG  EKG Interpretation None       Radiology No results found.  Procedures Procedures (including critical care time)  Medications Ordered in ED Medications  ibuprofen (ADVIL,MOTRIN) 800 MG tablet (800 mg  Given 10/16/16 1625)    Initial Impression / Assessment and Plan / ED Course  I have reviewed the triage vital signs and the nursing notes.  Pertinent labs & imaging results that were available during my care of the patient were reviewed by me and considered in my medical decision making (see chart for details).  Patient presents to ED with worsening left hand swelling. Patient seen by primary care provider earlier today and was given antibiotics and steroid injection for presumed cellulitis. Patient presented to ED because hand swelling has worsened. Does not appear to be a cellulitis. More like a venomous reaction with associated swelling. While in ED obtained labs which were unremarkable. No signs of infection. Electrolytes and renal function normal. Hand was marked to monitor for progression of swelling. Rx given for short steroid course to start tomorrow. Also given pain medication. To continue the antibiotics given by PCP. Encouraged patient to elevate arm and ice.   On reevaluation there had been no progression of swelling or redness. Patient stable for discharge. Return precautions discussed. Patient  encouraged to follow-up with PCP in 2 days to have hand re-evaluated.   Final Clinical Impressions(s) / ED Diagnoses   Final diagnoses:  Allergy to insect venom    New Prescriptions New Prescriptions   No medications on file     Katheren Shams, DO 10/16/16 2127    Margette Fast, MD 10/17/16 [phone]

## 2016-10-21 LAB — CULTURE, BLOOD (ROUTINE X 2)
Culture: NO GROWTH
Culture: NO GROWTH
Special Requests: ADEQUATE
Special Requests: ADEQUATE

## 2016-12-05 DIAGNOSIS — G4733 Obstructive sleep apnea (adult) (pediatric): Secondary | ICD-10-CM | POA: Diagnosis not present

## 2017-01-03 IMAGING — US US SOFT TISSUE HEAD/NECK
1 series · 13 of 21 positions shown · non-contrast
Comparison: CT scan of the neck 01/06/2016

CLINICAL DATA: 55-year-old male with a palpable right neck mass

EXAM:
ULTRASOUND OF HEAD/NECK SOFT TISSUES
TECHNIQUE: Ultrasound examination of the head and neck soft tissues was
performed in the area of clinical concern.

[Series 1: us soft tissue head/neck · 0.08mm/px · 13 of 21 slices shown]
[im 1/21]
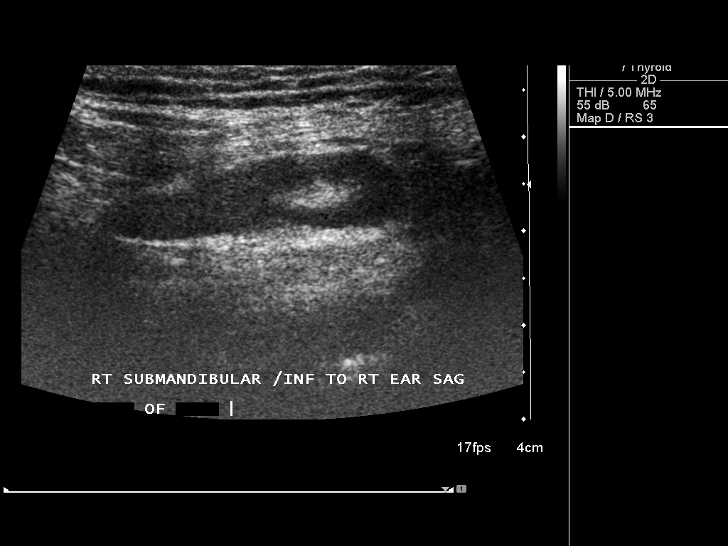
[im 3/21]
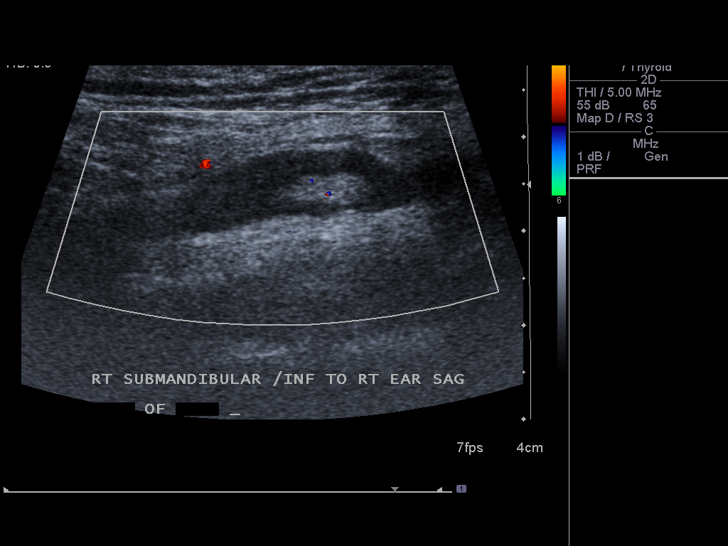
[im 5/21]
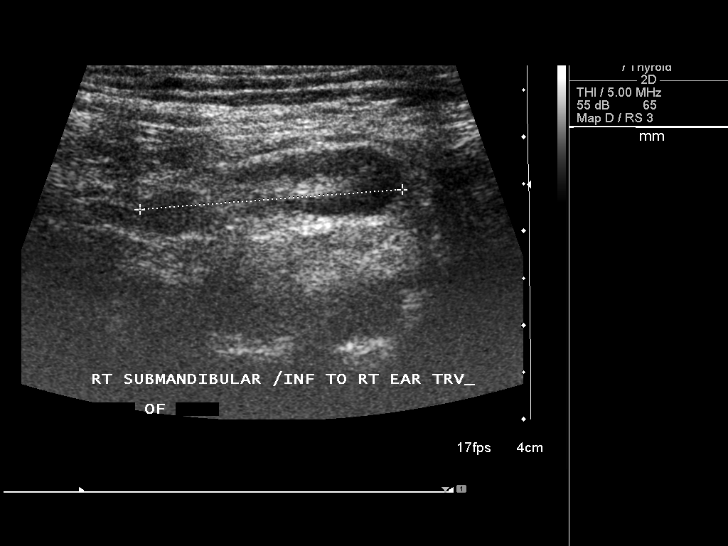
[im 6/21]
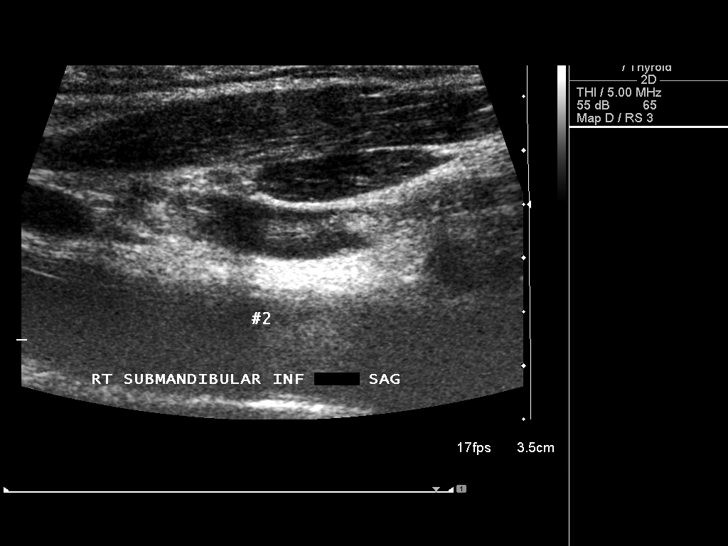
[im 8/21]
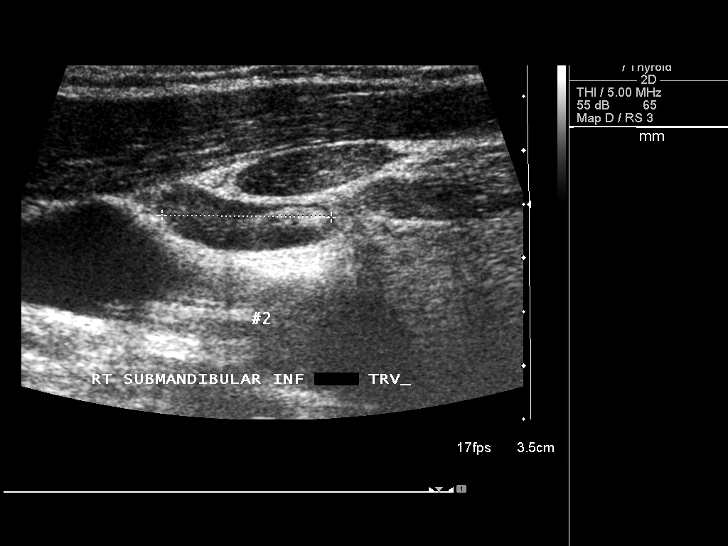
[im 9/21]
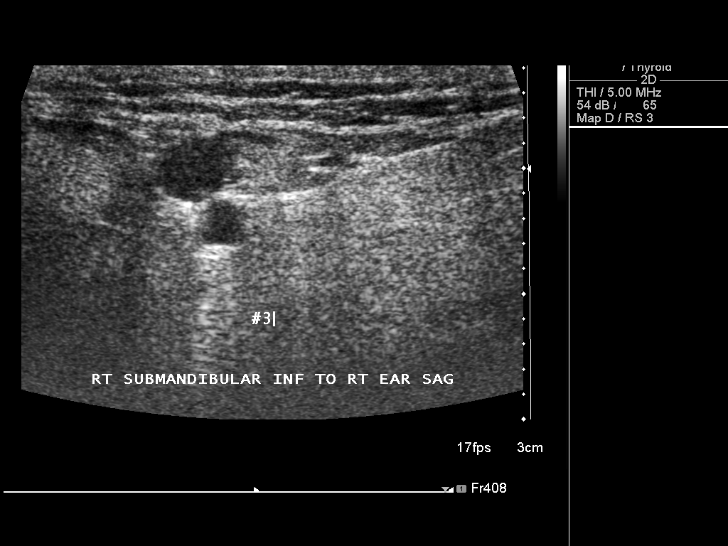
[im 11/21]
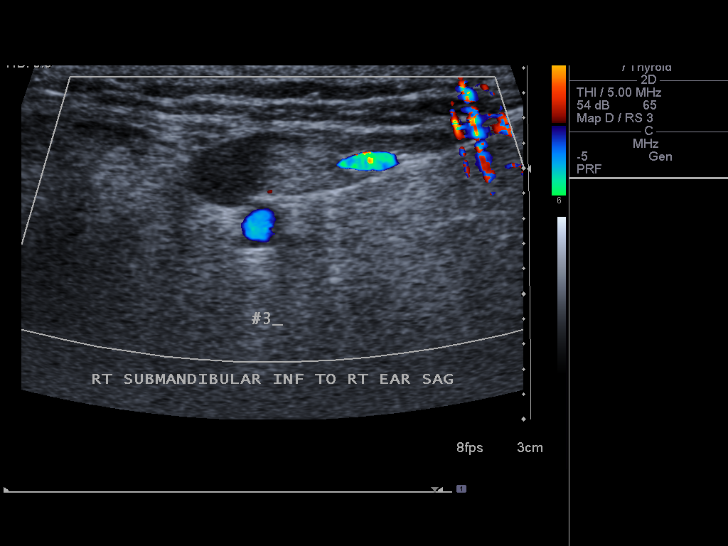
[im 13/21]
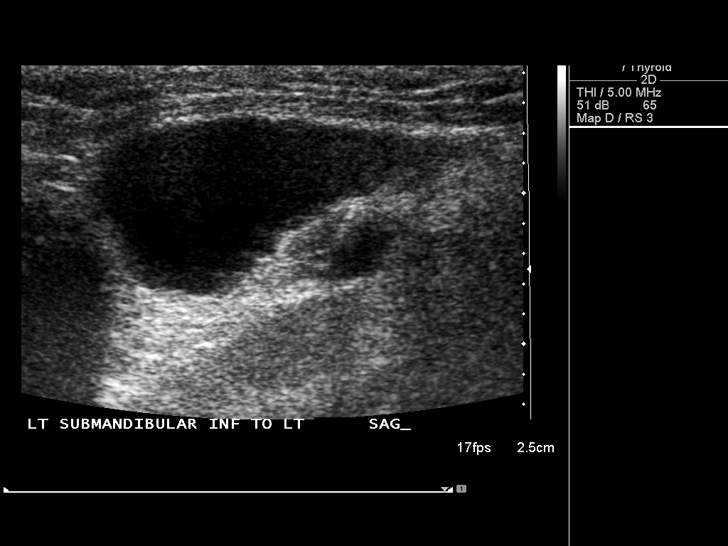
[im 14/21]
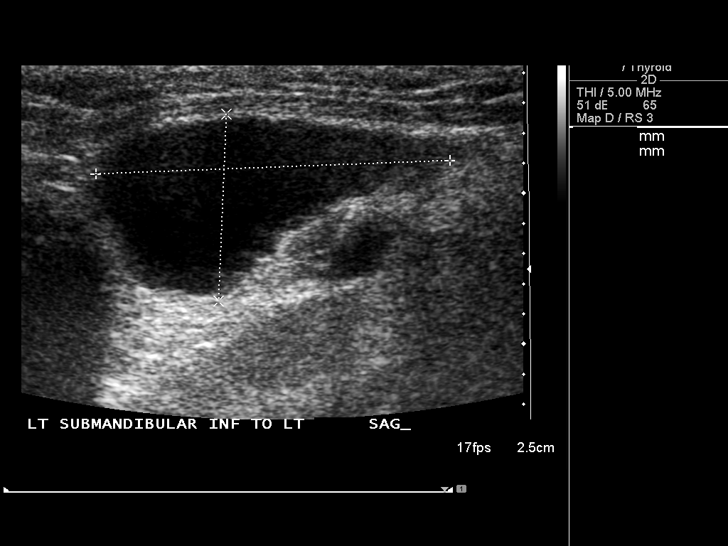
[im 16/21]
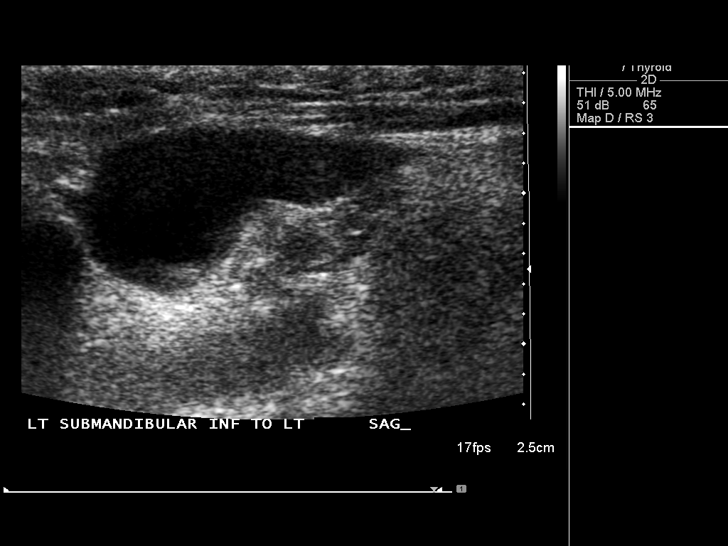
[im 17/21]
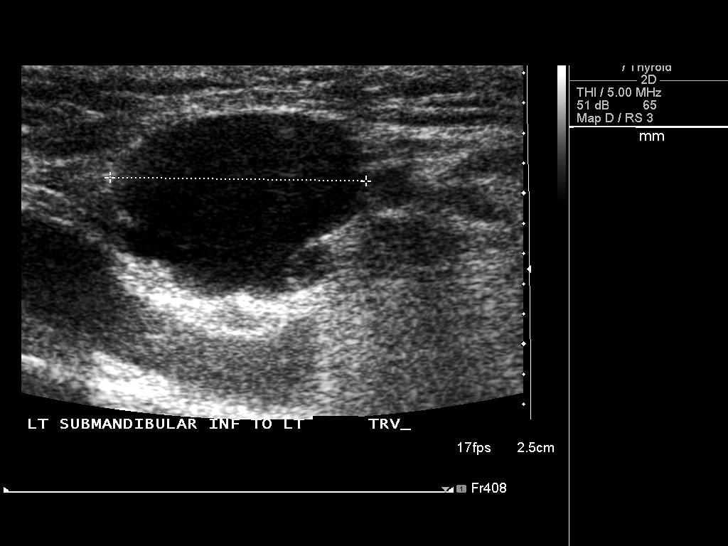
[im 19/21]
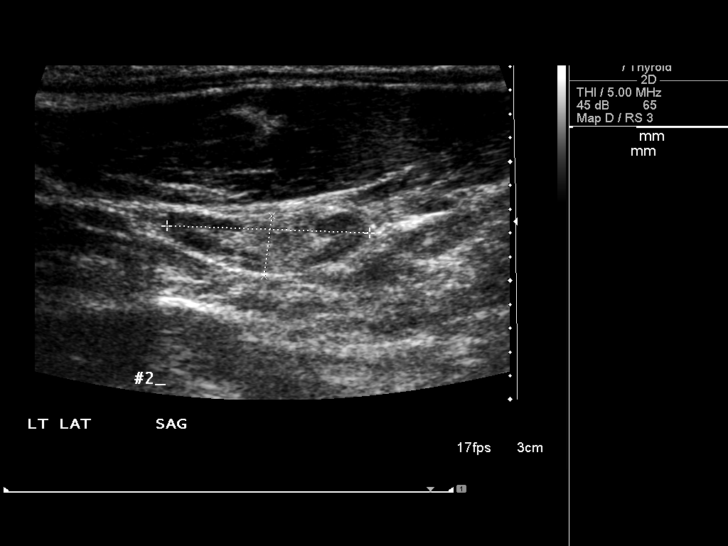
[im 21/21]
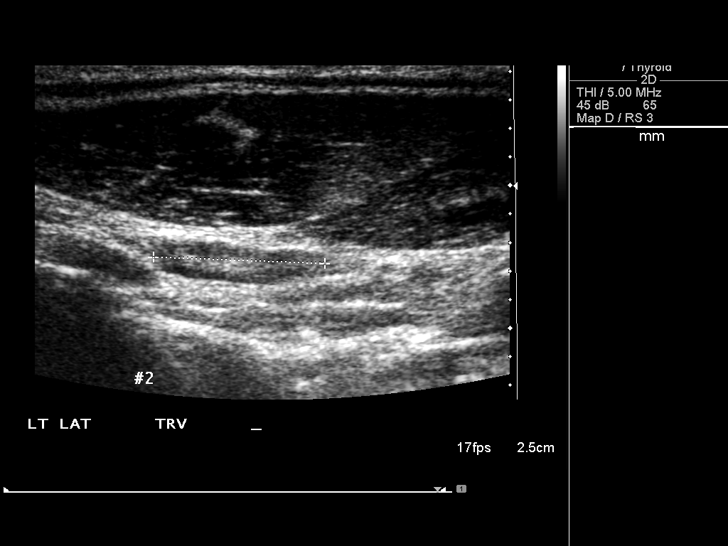

[13 of 21 positions shown; findings below may reference images not displayed]

FINDINGS: Sonographic interrogation of the palpable abnormality in the right
submandibular region demonstrates a prominent hypoechoic solid mass
with a central echogenic hilum consistent with a lymph node. The
node measures 9 mm in short axis which is within normal limits.
There are 2 adjacent similar appearing lymph nodes measuring 6 and 5
mm in short axis.

Sonographic interrogation of the left submandibular region
demonstrates an abnormal and enlarged lymph node which measures
cm in short axis. This likely corresponds to the 1.4 cm noted
identified on the recent prior CT scan. This node is particularly
hypoechoic and the fatty hilum is largely obscured. Additional
sonographically normal appearing left-sided lymph nodes are also
imaged.
IMPRESSION: 1. The palpable abnormality in the right neck corresponds with a
sonographically unremarkable lymph node which is at the upper limits
of normal in size.
2. However, there is an abnormal appearing an enlarged lymph node in
the left submandibular region which is concerning for metastatic
adenopathy versus lymphoma. Consider ultrasound-guided biopsy.

## 2017-01-15 DIAGNOSIS — N5201 Erectile dysfunction due to arterial insufficiency: Secondary | ICD-10-CM | POA: Diagnosis not present

## 2017-01-15 DIAGNOSIS — R972 Elevated prostate specific antigen [PSA]: Secondary | ICD-10-CM | POA: Diagnosis not present

## 2017-01-15 DIAGNOSIS — E291 Testicular hypofunction: Secondary | ICD-10-CM | POA: Diagnosis not present

## 2017-02-13 DIAGNOSIS — J029 Acute pharyngitis, unspecified: Secondary | ICD-10-CM | POA: Diagnosis not present

## 2017-02-13 DIAGNOSIS — R591 Generalized enlarged lymph nodes: Secondary | ICD-10-CM | POA: Diagnosis not present

## 2017-02-28 DIAGNOSIS — G4733 Obstructive sleep apnea (adult) (pediatric): Secondary | ICD-10-CM | POA: Diagnosis not present

## 2017-02-28 DIAGNOSIS — H04123 Dry eye syndrome of bilateral lacrimal glands: Secondary | ICD-10-CM | POA: Diagnosis not present

## 2017-02-28 DIAGNOSIS — Z961 Presence of intraocular lens: Secondary | ICD-10-CM | POA: Diagnosis not present

## 2017-03-29 DIAGNOSIS — F419 Anxiety disorder, unspecified: Secondary | ICD-10-CM | POA: Diagnosis not present

## 2017-03-29 DIAGNOSIS — R0981 Nasal congestion: Secondary | ICD-10-CM | POA: Diagnosis not present

## 2017-03-29 DIAGNOSIS — N529 Male erectile dysfunction, unspecified: Secondary | ICD-10-CM | POA: Diagnosis not present

## 2017-03-29 DIAGNOSIS — Z23 Encounter for immunization: Secondary | ICD-10-CM | POA: Diagnosis not present

## 2017-03-29 DIAGNOSIS — E782 Mixed hyperlipidemia: Secondary | ICD-10-CM | POA: Diagnosis not present

## 2017-05-10 DIAGNOSIS — M25569 Pain in unspecified knee: Secondary | ICD-10-CM | POA: Diagnosis not present

## 2017-06-20 DIAGNOSIS — M1711 Unilateral primary osteoarthritis, right knee: Secondary | ICD-10-CM | POA: Diagnosis not present

## 2017-06-24 DIAGNOSIS — H43811 Vitreous degeneration, right eye: Secondary | ICD-10-CM | POA: Diagnosis not present

## 2017-07-25 DIAGNOSIS — H43811 Vitreous degeneration, right eye: Secondary | ICD-10-CM | POA: Diagnosis not present

## 2017-08-21 DIAGNOSIS — R591 Generalized enlarged lymph nodes: Secondary | ICD-10-CM | POA: Diagnosis not present

## 2017-08-21 DIAGNOSIS — H9191 Unspecified hearing loss, right ear: Secondary | ICD-10-CM | POA: Diagnosis not present

## 2017-09-12 DIAGNOSIS — M9903 Segmental and somatic dysfunction of lumbar region: Secondary | ICD-10-CM | POA: Diagnosis not present

## 2017-09-12 DIAGNOSIS — M9904 Segmental and somatic dysfunction of sacral region: Secondary | ICD-10-CM | POA: Diagnosis not present

## 2017-09-12 DIAGNOSIS — M545 Low back pain: Secondary | ICD-10-CM | POA: Diagnosis not present

## 2017-09-12 DIAGNOSIS — M9905 Segmental and somatic dysfunction of pelvic region: Secondary | ICD-10-CM | POA: Diagnosis not present

## 2017-09-16 DIAGNOSIS — M9905 Segmental and somatic dysfunction of pelvic region: Secondary | ICD-10-CM | POA: Diagnosis not present

## 2017-09-16 DIAGNOSIS — M9903 Segmental and somatic dysfunction of lumbar region: Secondary | ICD-10-CM | POA: Diagnosis not present

## 2017-09-16 DIAGNOSIS — M9904 Segmental and somatic dysfunction of sacral region: Secondary | ICD-10-CM | POA: Diagnosis not present

## 2017-09-16 DIAGNOSIS — M545 Low back pain: Secondary | ICD-10-CM | POA: Diagnosis not present

## 2017-09-17 DIAGNOSIS — H90A22 Sensorineural hearing loss, unilateral, left ear, with restricted hearing on the contralateral side: Secondary | ICD-10-CM | POA: Diagnosis not present

## 2017-09-17 DIAGNOSIS — R591 Generalized enlarged lymph nodes: Secondary | ICD-10-CM | POA: Diagnosis not present

## 2017-09-26 DIAGNOSIS — M9903 Segmental and somatic dysfunction of lumbar region: Secondary | ICD-10-CM | POA: Diagnosis not present

## 2017-09-26 DIAGNOSIS — M545 Low back pain: Secondary | ICD-10-CM | POA: Diagnosis not present

## 2017-09-26 DIAGNOSIS — M9905 Segmental and somatic dysfunction of pelvic region: Secondary | ICD-10-CM | POA: Diagnosis not present

## 2017-09-26 DIAGNOSIS — M9904 Segmental and somatic dysfunction of sacral region: Secondary | ICD-10-CM | POA: Diagnosis not present

## 2017-10-04 DIAGNOSIS — M9905 Segmental and somatic dysfunction of pelvic region: Secondary | ICD-10-CM | POA: Diagnosis not present

## 2017-10-04 DIAGNOSIS — M9903 Segmental and somatic dysfunction of lumbar region: Secondary | ICD-10-CM | POA: Diagnosis not present

## 2017-10-04 DIAGNOSIS — M9904 Segmental and somatic dysfunction of sacral region: Secondary | ICD-10-CM | POA: Diagnosis not present

## 2017-10-04 DIAGNOSIS — M545 Low back pain: Secondary | ICD-10-CM | POA: Diagnosis not present

## 2017-10-17 DIAGNOSIS — M545 Low back pain: Secondary | ICD-10-CM | POA: Diagnosis not present

## 2017-10-17 DIAGNOSIS — M9904 Segmental and somatic dysfunction of sacral region: Secondary | ICD-10-CM | POA: Diagnosis not present

## 2017-10-17 DIAGNOSIS — M9903 Segmental and somatic dysfunction of lumbar region: Secondary | ICD-10-CM | POA: Diagnosis not present

## 2017-10-17 DIAGNOSIS — M9905 Segmental and somatic dysfunction of pelvic region: Secondary | ICD-10-CM | POA: Diagnosis not present

## 2017-10-30 DIAGNOSIS — Z23 Encounter for immunization: Secondary | ICD-10-CM | POA: Diagnosis not present

## 2017-10-30 DIAGNOSIS — F4310 Post-traumatic stress disorder, unspecified: Secondary | ICD-10-CM | POA: Diagnosis not present

## 2017-10-30 DIAGNOSIS — E782 Mixed hyperlipidemia: Secondary | ICD-10-CM | POA: Diagnosis not present

## 2017-10-30 DIAGNOSIS — Z0000 Encounter for general adult medical examination without abnormal findings: Secondary | ICD-10-CM | POA: Diagnosis not present

## 2017-10-30 DIAGNOSIS — N529 Male erectile dysfunction, unspecified: Secondary | ICD-10-CM | POA: Diagnosis not present

## 2017-11-01 DIAGNOSIS — M545 Low back pain: Secondary | ICD-10-CM | POA: Diagnosis not present

## 2017-11-01 DIAGNOSIS — M9903 Segmental and somatic dysfunction of lumbar region: Secondary | ICD-10-CM | POA: Diagnosis not present

## 2017-11-01 DIAGNOSIS — M9905 Segmental and somatic dysfunction of pelvic region: Secondary | ICD-10-CM | POA: Diagnosis not present

## 2017-11-01 DIAGNOSIS — M9904 Segmental and somatic dysfunction of sacral region: Secondary | ICD-10-CM | POA: Diagnosis not present

## 2017-11-03 DIAGNOSIS — L089 Local infection of the skin and subcutaneous tissue, unspecified: Secondary | ICD-10-CM | POA: Diagnosis not present

## 2017-11-03 DIAGNOSIS — W57XXXA Bitten or stung by nonvenomous insect and other nonvenomous arthropods, initial encounter: Secondary | ICD-10-CM | POA: Diagnosis not present

## 2017-11-03 DIAGNOSIS — S80861A Insect bite (nonvenomous), right lower leg, initial encounter: Secondary | ICD-10-CM | POA: Diagnosis not present

## 2017-11-11 DIAGNOSIS — F411 Generalized anxiety disorder: Secondary | ICD-10-CM | POA: Diagnosis not present

## 2017-11-15 DIAGNOSIS — M9902 Segmental and somatic dysfunction of thoracic region: Secondary | ICD-10-CM | POA: Diagnosis not present

## 2017-11-15 DIAGNOSIS — M9901 Segmental and somatic dysfunction of cervical region: Secondary | ICD-10-CM | POA: Diagnosis not present

## 2017-11-15 DIAGNOSIS — M9903 Segmental and somatic dysfunction of lumbar region: Secondary | ICD-10-CM | POA: Diagnosis not present

## 2017-11-15 DIAGNOSIS — G44209 Tension-type headache, unspecified, not intractable: Secondary | ICD-10-CM | POA: Diagnosis not present

## 2017-11-26 DIAGNOSIS — F411 Generalized anxiety disorder: Secondary | ICD-10-CM | POA: Diagnosis not present

## 2017-11-28 DIAGNOSIS — G44209 Tension-type headache, unspecified, not intractable: Secondary | ICD-10-CM | POA: Diagnosis not present

## 2017-12-04 DIAGNOSIS — B356 Tinea cruris: Secondary | ICD-10-CM | POA: Diagnosis not present

## 2017-12-04 DIAGNOSIS — R51 Headache: Secondary | ICD-10-CM | POA: Diagnosis not present

## 2017-12-05 DIAGNOSIS — F411 Generalized anxiety disorder: Secondary | ICD-10-CM | POA: Diagnosis not present

## 2017-12-18 DIAGNOSIS — H0012 Chalazion right lower eyelid: Secondary | ICD-10-CM | POA: Diagnosis not present

## 2017-12-18 DIAGNOSIS — H0100A Unspecified blepharitis right eye, upper and lower eyelids: Secondary | ICD-10-CM | POA: Diagnosis not present

## 2017-12-18 DIAGNOSIS — H0015 Chalazion left lower eyelid: Secondary | ICD-10-CM | POA: Diagnosis not present

## 2017-12-18 DIAGNOSIS — H0100B Unspecified blepharitis left eye, upper and lower eyelids: Secondary | ICD-10-CM | POA: Diagnosis not present

## 2017-12-19 DIAGNOSIS — F411 Generalized anxiety disorder: Secondary | ICD-10-CM | POA: Diagnosis not present

## 2017-12-19 DIAGNOSIS — R197 Diarrhea, unspecified: Secondary | ICD-10-CM | POA: Diagnosis not present

## 2018-01-02 DIAGNOSIS — M9901 Segmental and somatic dysfunction of cervical region: Secondary | ICD-10-CM | POA: Diagnosis not present

## 2018-01-02 DIAGNOSIS — M9903 Segmental and somatic dysfunction of lumbar region: Secondary | ICD-10-CM | POA: Diagnosis not present

## 2018-01-02 DIAGNOSIS — G44209 Tension-type headache, unspecified, not intractable: Secondary | ICD-10-CM | POA: Diagnosis not present

## 2018-01-07 DIAGNOSIS — F411 Generalized anxiety disorder: Secondary | ICD-10-CM | POA: Diagnosis not present

## 2018-01-09 DIAGNOSIS — J069 Acute upper respiratory infection, unspecified: Secondary | ICD-10-CM | POA: Diagnosis not present

## 2018-01-13 DIAGNOSIS — G4733 Obstructive sleep apnea (adult) (pediatric): Secondary | ICD-10-CM | POA: Diagnosis not present

## 2018-01-21 DIAGNOSIS — E291 Testicular hypofunction: Secondary | ICD-10-CM | POA: Diagnosis not present

## 2018-01-21 DIAGNOSIS — R972 Elevated prostate specific antigen [PSA]: Secondary | ICD-10-CM | POA: Diagnosis not present

## 2018-01-21 DIAGNOSIS — F411 Generalized anxiety disorder: Secondary | ICD-10-CM | POA: Diagnosis not present

## 2018-01-21 DIAGNOSIS — N5201 Erectile dysfunction due to arterial insufficiency: Secondary | ICD-10-CM | POA: Diagnosis not present

## 2018-01-30 DIAGNOSIS — M9901 Segmental and somatic dysfunction of cervical region: Secondary | ICD-10-CM | POA: Diagnosis not present

## 2018-01-30 DIAGNOSIS — G44209 Tension-type headache, unspecified, not intractable: Secondary | ICD-10-CM | POA: Diagnosis not present

## 2018-02-04 DIAGNOSIS — F411 Generalized anxiety disorder: Secondary | ICD-10-CM | POA: Diagnosis not present

## 2018-02-18 DIAGNOSIS — F4321 Adjustment disorder with depressed mood: Secondary | ICD-10-CM | POA: Diagnosis not present

## 2018-03-03 DIAGNOSIS — H5202 Hypermetropia, left eye: Secondary | ICD-10-CM | POA: Diagnosis not present

## 2018-03-03 DIAGNOSIS — H0012 Chalazion right lower eyelid: Secondary | ICD-10-CM | POA: Diagnosis not present

## 2018-03-03 DIAGNOSIS — Z961 Presence of intraocular lens: Secondary | ICD-10-CM | POA: Diagnosis not present

## 2018-03-04 DIAGNOSIS — F4321 Adjustment disorder with depressed mood: Secondary | ICD-10-CM | POA: Diagnosis not present

## 2018-03-05 DIAGNOSIS — M9901 Segmental and somatic dysfunction of cervical region: Secondary | ICD-10-CM | POA: Diagnosis not present

## 2018-03-05 DIAGNOSIS — M9902 Segmental and somatic dysfunction of thoracic region: Secondary | ICD-10-CM | POA: Diagnosis not present

## 2018-03-05 DIAGNOSIS — M9903 Segmental and somatic dysfunction of lumbar region: Secondary | ICD-10-CM | POA: Diagnosis not present

## 2018-03-05 DIAGNOSIS — G44209 Tension-type headache, unspecified, not intractable: Secondary | ICD-10-CM | POA: Diagnosis not present

## 2018-03-18 DIAGNOSIS — F4321 Adjustment disorder with depressed mood: Secondary | ICD-10-CM | POA: Diagnosis not present

## 2018-03-19 DIAGNOSIS — R591 Generalized enlarged lymph nodes: Secondary | ICD-10-CM | POA: Diagnosis not present

## 2018-04-03 DIAGNOSIS — F4321 Adjustment disorder with depressed mood: Secondary | ICD-10-CM | POA: Diagnosis not present

## 2018-04-15 DIAGNOSIS — F4321 Adjustment disorder with depressed mood: Secondary | ICD-10-CM | POA: Diagnosis not present

## 2018-04-30 DIAGNOSIS — H0012 Chalazion right lower eyelid: Secondary | ICD-10-CM | POA: Diagnosis not present

## 2018-05-01 DIAGNOSIS — F4321 Adjustment disorder with depressed mood: Secondary | ICD-10-CM | POA: Diagnosis not present

## 2018-05-06 DIAGNOSIS — R0981 Nasal congestion: Secondary | ICD-10-CM | POA: Diagnosis not present

## 2018-05-06 DIAGNOSIS — F419 Anxiety disorder, unspecified: Secondary | ICD-10-CM | POA: Diagnosis not present

## 2018-05-06 DIAGNOSIS — N529 Male erectile dysfunction, unspecified: Secondary | ICD-10-CM | POA: Diagnosis not present

## 2018-05-06 DIAGNOSIS — R42 Dizziness and giddiness: Secondary | ICD-10-CM | POA: Diagnosis not present

## 2018-05-20 DIAGNOSIS — F4321 Adjustment disorder with depressed mood: Secondary | ICD-10-CM | POA: Diagnosis not present

## 2018-05-27 DIAGNOSIS — F4321 Adjustment disorder with depressed mood: Secondary | ICD-10-CM | POA: Diagnosis not present

## 2018-06-09 DIAGNOSIS — F4321 Adjustment disorder with depressed mood: Secondary | ICD-10-CM | POA: Diagnosis not present

## 2018-06-26 DIAGNOSIS — H66003 Acute suppurative otitis media without spontaneous rupture of ear drum, bilateral: Secondary | ICD-10-CM | POA: Diagnosis not present

## 2018-06-26 DIAGNOSIS — R0981 Nasal congestion: Secondary | ICD-10-CM | POA: Diagnosis not present

## 2018-06-26 DIAGNOSIS — H60393 Other infective otitis externa, bilateral: Secondary | ICD-10-CM | POA: Diagnosis not present

## 2018-07-29 DIAGNOSIS — F4321 Adjustment disorder with depressed mood: Secondary | ICD-10-CM | POA: Diagnosis not present

## 2018-08-09 ENCOUNTER — Encounter: Payer: Self-pay | Admitting: Internal Medicine

## 2018-08-19 DIAGNOSIS — F411 Generalized anxiety disorder: Secondary | ICD-10-CM | POA: Diagnosis not present

## 2018-11-12 DIAGNOSIS — F4321 Adjustment disorder with depressed mood: Secondary | ICD-10-CM | POA: Diagnosis not present

## 2018-11-13 DIAGNOSIS — G44309 Post-traumatic headache, unspecified, not intractable: Secondary | ICD-10-CM | POA: Diagnosis not present

## 2018-11-13 DIAGNOSIS — Z6835 Body mass index (BMI) 35.0-35.9, adult: Secondary | ICD-10-CM | POA: Diagnosis not present

## 2018-11-13 DIAGNOSIS — Z13220 Encounter for screening for lipoid disorders: Secondary | ICD-10-CM | POA: Diagnosis not present

## 2018-11-13 DIAGNOSIS — M6530 Trigger finger, unspecified finger: Secondary | ICD-10-CM | POA: Diagnosis not present

## 2018-11-13 DIAGNOSIS — Z125 Encounter for screening for malignant neoplasm of prostate: Secondary | ICD-10-CM | POA: Diagnosis not present

## 2018-11-13 DIAGNOSIS — M2550 Pain in unspecified joint: Secondary | ICD-10-CM | POA: Diagnosis not present

## 2018-11-13 DIAGNOSIS — M26629 Arthralgia of temporomandibular joint, unspecified side: Secondary | ICD-10-CM | POA: Diagnosis not present

## 2018-11-13 DIAGNOSIS — M791 Myalgia, unspecified site: Secondary | ICD-10-CM | POA: Diagnosis not present

## 2018-11-14 ENCOUNTER — Encounter: Payer: Self-pay | Admitting: Cardiology

## 2018-11-27 DIAGNOSIS — M79641 Pain in right hand: Secondary | ICD-10-CM | POA: Diagnosis not present

## 2018-11-27 DIAGNOSIS — M47818 Spondylosis without myelopathy or radiculopathy, sacral and sacrococcygeal region: Secondary | ICD-10-CM | POA: Diagnosis not present

## 2018-11-27 DIAGNOSIS — M2550 Pain in unspecified joint: Secondary | ICD-10-CM | POA: Diagnosis not present

## 2018-12-02 DIAGNOSIS — E782 Mixed hyperlipidemia: Secondary | ICD-10-CM | POA: Diagnosis not present

## 2018-12-02 DIAGNOSIS — D696 Thrombocytopenia, unspecified: Secondary | ICD-10-CM | POA: Diagnosis not present

## 2018-12-02 DIAGNOSIS — F419 Anxiety disorder, unspecified: Secondary | ICD-10-CM | POA: Diagnosis not present

## 2018-12-02 DIAGNOSIS — E559 Vitamin D deficiency, unspecified: Secondary | ICD-10-CM | POA: Diagnosis not present

## 2018-12-02 DIAGNOSIS — Z0000 Encounter for general adult medical examination without abnormal findings: Secondary | ICD-10-CM | POA: Diagnosis not present

## 2018-12-18 DIAGNOSIS — F4321 Adjustment disorder with depressed mood: Secondary | ICD-10-CM | POA: Diagnosis not present

## 2019-01-14 ENCOUNTER — Encounter: Payer: Self-pay | Admitting: Internal Medicine

## 2019-01-27 ENCOUNTER — Other Ambulatory Visit: Payer: Self-pay

## 2019-01-27 DIAGNOSIS — Z20822 Contact with and (suspected) exposure to covid-19: Secondary | ICD-10-CM

## 2019-01-28 LAB — NOVEL CORONAVIRUS, NAA: SARS-CoV-2, NAA: NOT DETECTED

## 2019-02-03 DIAGNOSIS — F4321 Adjustment disorder with depressed mood: Secondary | ICD-10-CM | POA: Diagnosis not present

## 2019-02-18 ENCOUNTER — Other Ambulatory Visit: Payer: Self-pay

## 2019-02-18 ENCOUNTER — Encounter: Payer: Self-pay | Admitting: Cardiology

## 2019-02-18 ENCOUNTER — Ambulatory Visit (INDEPENDENT_AMBULATORY_CARE_PROVIDER_SITE_OTHER): Payer: BC Managed Care – PPO | Admitting: Cardiology

## 2019-02-18 VITALS — BP 136/76 | HR 70 | Ht 67.0 in | Wt 222.8 lb

## 2019-02-18 DIAGNOSIS — Z716 Tobacco abuse counseling: Secondary | ICD-10-CM

## 2019-02-18 DIAGNOSIS — Z6834 Body mass index (BMI) 34.0-34.9, adult: Secondary | ICD-10-CM

## 2019-02-18 DIAGNOSIS — Z7189 Other specified counseling: Secondary | ICD-10-CM | POA: Diagnosis not present

## 2019-02-18 DIAGNOSIS — Z713 Dietary counseling and surveillance: Secondary | ICD-10-CM

## 2019-02-18 DIAGNOSIS — Z8249 Family history of ischemic heart disease and other diseases of the circulatory system: Secondary | ICD-10-CM | POA: Diagnosis not present

## 2019-02-18 DIAGNOSIS — E782 Mixed hyperlipidemia: Secondary | ICD-10-CM

## 2019-02-18 DIAGNOSIS — E6609 Other obesity due to excess calories: Secondary | ICD-10-CM

## 2019-02-18 DIAGNOSIS — Z7182 Exercise counseling: Secondary | ICD-10-CM

## 2019-02-18 NOTE — Patient Instructions (Signed)
Medication Instructions:  Your Physician recommend you continue on your current medication as directed.    If you need a refill on your cardiac medications before your next appointment, please call your pharmacy.   Lab work: Your physician recommends that you return for lab work in 6 week (Fasting Lipid).  If you have labs (blood work) drawn today and your tests are completely normal, you will receive your results only by: Marland Kitchen MyChart Message (if you have MyChart) OR . A paper copy in the mail If you have any lab test that is abnormal or we need to change your treatment, we will call you to review the results.  Testing/Procedures: None  Follow-Up: At [facility], you and your health needs are our priority.  As part of our continuing mission to provide you with exceptional heart care, we have created designated Provider Care Teams.  These Care Teams include your primary Cardiologist (physician) and Advanced Practice Providers (APPs -  Physician Assistants and Nurse Practitioners) who all work together to provide you with the care you need, when you need it. You will need a follow up appointment in 1 years.  Please call our office 2 months in advance to schedule this appointment.  You may see Dr. Harrell Gave or one of the following Advanced Practice Providers on your designated Care Team:   Rosaria Ferries, PA-C . Jory Sims, DNP, ANP
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
## 2019-02-19 ENCOUNTER — Ambulatory Visit (AMBULATORY_SURGERY_CENTER): Payer: Self-pay | Admitting: *Deleted

## 2019-02-19 ENCOUNTER — Other Ambulatory Visit: Payer: Self-pay

## 2019-02-19 ENCOUNTER — Encounter: Payer: Self-pay | Admitting: Internal Medicine

## 2019-02-19 VITALS — Temp 97.8°F | Ht 66.0 in | Wt 223.6 lb

## 2019-02-19 DIAGNOSIS — Z86010 Personal history of colonic polyps: Secondary | ICD-10-CM

## 2019-02-19 MED ORDER — SUPREP BOWEL PREP KIT 17.5-3.13-1.6 GM/177ML PO SOLN: 1.0000 | Freq: Once | ORAL | 0 refills | Status: AC

## 2019-02-19 NOTE — Progress Notes (Signed)
Patient denies any allergies to egg or soy products. Patient denies complications with anesthesia/sedation.  Patient denies oxygen use at home and denies diet medications. Emmi instructions for colonoscopy explained and given to patient.  Suprep coupon given.

## 2019-03-03 ENCOUNTER — Telehealth: Payer: Self-pay

## 2019-03-03 NOTE — Telephone Encounter (Signed)
Covid-19 screening questions   Do you now or have you had a fever in the last 14 days?  Do you have any respiratory symptoms of shortness of breath or cough now or in the last 14 days?  Do you have any family members or close contacts with diagnosed or suspected Covid-19 in the past 14 days?  Have you been tested for Covid-19 and found to be positive?       

## 2019-03-03 NOTE — Telephone Encounter (Signed)
Patient called back and answered "NO" to all screening questions. °

## 2019-03-04 ENCOUNTER — Ambulatory Visit (AMBULATORY_SURGERY_CENTER): Payer: BC Managed Care – PPO | Admitting: Internal Medicine

## 2019-03-04 ENCOUNTER — Other Ambulatory Visit: Payer: Self-pay | Admitting: Internal Medicine

## 2019-03-04 ENCOUNTER — Encounter: Payer: Self-pay | Admitting: Internal Medicine

## 2019-03-04 ENCOUNTER — Other Ambulatory Visit: Payer: Self-pay

## 2019-03-04 VITALS — BP 135/70 | HR 59 | Temp 98.1°F | Resp 14 | Ht 66.0 in | Wt 223.0 lb

## 2019-03-04 DIAGNOSIS — D120 Benign neoplasm of cecum: Secondary | ICD-10-CM

## 2019-03-04 DIAGNOSIS — Z86010 Personal history of colonic polyps: Secondary | ICD-10-CM | POA: Diagnosis not present

## 2019-03-04 DIAGNOSIS — D124 Benign neoplasm of descending colon: Secondary | ICD-10-CM

## 2019-03-04 DIAGNOSIS — Z1211 Encounter for screening for malignant neoplasm of colon: Secondary | ICD-10-CM | POA: Diagnosis not present

## 2019-03-04 MED ORDER — SODIUM CHLORIDE 0.9 % IV SOLN: 500.0000 mL | Freq: Once | INTRAVENOUS | Status: DC

## 2019-03-04 NOTE — Progress Notes (Signed)
Pt's states no medical or surgical changes since previsit or office visit.  Temp KA Vitals CW 

## 2019-03-04 NOTE — Patient Instructions (Signed)
Discharge instructions given. Handouts on polyps and diverticulosis. Resume previous medications. YOU HAD AN ENDOSCOPIC PROCEDURE TODAY AT THE [facility]:   Refer to the procedure report that was given to you for any specific questions about what was found during the examination.  If the procedure report does not answer your questions, please call your gastroenterologist to clarify.  If you requested that your care partner not be given the details of your procedure findings, then the procedure report has been included in a sealed envelope for you to review at your convenience later.  YOU SHOULD EXPECT: Some feelings of bloating in the abdomen. Passage of more gas than usual.  Walking can help get rid of the air that was put into your GI tract during the procedure and reduce the bloating. If you had a lower endoscopy (such as a colonoscopy or flexible sigmoidoscopy) you may notice spotting of blood in your stool or on the toilet paper. If you underwent a bowel prep for your procedure, you may not have a normal bowel movement for a few days.  Please Note:  You might notice some irritation and congestion in your nose or some drainage.  This is from the oxygen used during your procedure.  There is no need for concern and it should clear up in a day or so.  SYMPTOMS TO REPORT IMMEDIATELY:   Following lower endoscopy (colonoscopy or flexible sigmoidoscopy):  Excessive amounts of blood in the stool  Significant tenderness or worsening of abdominal pains  Swelling of the abdomen that is new, acute  Fever of 100F or higher   For urgent or emergent issues, a gastroenterologist can be reached at any hour by calling [phone].   DIET:  We do recommend a small meal at first, but then you may proceed to your regular diet.  Drink plenty of fluids but you should avoid alcoholic beverages for 24 hours.  ACTIVITY:  You should plan to take it easy for the rest of today and you should NOT  DRIVE or use heavy machinery until tomorrow (because of the sedation medicines used during the test).    FOLLOW UP: Our staff will call the number listed on your records 48-72 hours following your procedure to check on you and address any questions or concerns that you may have regarding the information given to you following your procedure. If we do not reach you, we will leave a message.  We will attempt to reach you two times.  During this call, we will ask if you have developed any symptoms of COVID 19. If you develop any symptoms (ie: fever, flu-like symptoms, shortness of breath, cough etc.) before then, please call [phone].  If you test positive for Covid 19 in the 2 weeks post procedure, please call and report this information to us.    If any biopsies were taken you will be contacted by phone or by letter within the next 1-3 weeks.  Please call us at [phone] if you have not heard about the biopsies in 3 weeks.    SIGNATURES/CONFIDENTIALITY: You and/or your care partner have signed paperwork which will be entered into your electronic medical record.  These signatures attest to the fact that that the information above on your After Visit Summary has been reviewed and is understood.  Full responsibility of the confidentiality of this discharge information lies with you and/or your care-partner. 

## 2019-03-04 NOTE — Progress Notes (Signed)
Pt Drowsy. VSS. To PACU, report to RN. No anesthetic complications noted.  

## 2019-03-04 NOTE — Op Note (Signed)
[place] Patient Name: [name] Procedure Date: 03/04/2019 8:01 AM MRN: [id number] Endoscopist: Jerene Bears , MD Age: 58 Referring MD:  Date of Birth: 05-13-1961 Gender: Male Account #: [id number] Procedure:                Colonoscopy Indications:              High risk colon cancer surveillance: Personal                            history of multiple (3 or more) adenomas, Last                            colonoscopy 3 years ago Medicines:                Monitored Anesthesia Care Procedure:                Pre-Anesthesia Assessment:                           - Prior to the procedure, a History and Physical                            was performed, and patient medications and                            allergies were reviewed. The patient's tolerance of                            previous anesthesia was also reviewed. The risks                            and benefits of the procedure and the sedation                            options and risks were discussed with the patient.                            All questions were answered, and informed consent                            was obtained. Prior Anticoagulants: The patient has                            taken no previous anticoagulant or antiplatelet                            agents. ASA Grade Assessment: II - A patient with                            mild systemic disease. After reviewing the risks                            and benefits, the patient was deemed in  satisfactory condition to undergo the procedure.                           After obtaining informed consent, the colonoscope                            was passed under direct vision. Throughout the                            procedure, the patient's blood pressure, pulse, and                            oxygen saturations were monitored continuously. The                            Colonoscope was introduced through the anus  and                            advanced to the cecum, identified by appendiceal                            orifice and ileocecal valve. The colonoscopy was                            performed without difficulty. The patient tolerated                            the procedure well. The quality of the bowel                            preparation was good. The ileocecal valve,                            appendiceal orifice, and rectum were photographed. Scope In: 8:09:51 AM Scope Out: 8:28:26 AM Scope Withdrawal Time: 0 hours 13 minutes 50 seconds  Total Procedure Duration: 0 hours 18 minutes 35 seconds  Findings:                 The digital rectal exam was normal.                           A 3 mm polyp was found in the cecum. The polyp was                            sessile. The polyp was removed with a cold snare.                            Resection and retrieval were complete.                           A 8 mm polyp was found in the descending colon. The                            polyp was sessile.  The polyp was removed with a                            cold snare. Resection and retrieval were complete.                           A few small-mouthed diverticula were found in the                            sigmoid colon.                           The retroflexed view of the distal rectum and anal                            verge was normal and showed no anal or rectal                            abnormalities. Complications:            No immediate complications. Estimated Blood Loss:     Estimated blood loss was minimal. Impression:               - One 3 mm polyp in the cecum, removed with a cold                            snare. Resected and retrieved.                           - One 8 mm polyp in the descending colon, removed                            with a cold snare. Resected and retrieved.                           - Diverticulosis in the sigmoid colon.                           -  The distal rectum and anal verge are normal on                            retroflexion view. Recommendation:           - Patient has a contact number available for                            emergencies. The signs and symptoms of potential                            delayed complications were discussed with the                            patient. Return to normal activities tomorrow.  Written discharge instructions were provided to the                            patient.                           - Resume previous diet.                           - Continue present medications.                           - Await pathology results.                           - Repeat colonoscopy is recommended for                            surveillance (likely in 5 years). The colonoscopy                            date will be determined after pathology results                            from today's exam become available for review. Jerene Bears, MD 03/04/2019 8:32:38 AM This report has been signed electronically.

## 2019-03-04 NOTE — Progress Notes (Signed)
Called to room to assist during endoscopic procedure.  Patient ID and intended procedure confirmed with present staff. Received instructions for my participation in the procedure from the performing physician.  

## 2019-03-05 DIAGNOSIS — F4321 Adjustment disorder with depressed mood: Secondary | ICD-10-CM | POA: Diagnosis not present

## 2019-03-06 ENCOUNTER — Telehealth: Payer: Self-pay

## 2019-03-06 NOTE — Telephone Encounter (Signed)
  Follow up Call-  Call back number 03/04/2019  Post procedure Call Back phone  # [phone]  Permission to leave phone message Yes  Some recent data might be hidden     Patient questions:  Do you have a fever, pain , or abdominal swelling? No. Pain Score  0 *  Have you tolerated food without any problems? Yes.    Have you been able to return to your normal activities? Yes.    Do you have any questions about your discharge instructions: Diet   No. Medications  No. Follow up visit  No.  Do you have questions or concerns about your Care? No.  Actions: * If pain score is 4 or above: No action needed, pain <4. 1. Have you developed a fever since your procedure? no  2.   Have you had an respiratory symptoms (SOB or cough) since your procedure? no  3.   Have you tested positive for COVID 19 since your procedure no  4.   Have you had any family members/close contacts diagnosed with the COVID 19 since your procedure?  no   If yes to any of these questions please route to Joylene John, RN and Alphonsa Gin, Therapist, sports.

## 2019-03-09 ENCOUNTER — Encounter: Payer: Self-pay | Admitting: Internal Medicine

## 2019-03-10 DIAGNOSIS — N5201 Erectile dysfunction due to arterial insufficiency: Secondary | ICD-10-CM | POA: Diagnosis not present

## 2019-03-10 DIAGNOSIS — R972 Elevated prostate specific antigen [PSA]: Secondary | ICD-10-CM | POA: Diagnosis not present

## 2019-03-10 DIAGNOSIS — E291 Testicular hypofunction: Secondary | ICD-10-CM | POA: Diagnosis not present

## 2019-03-18 ENCOUNTER — Telehealth: Payer: Self-pay | Admitting: Internal Medicine

## 2019-03-18 DIAGNOSIS — R109 Unspecified abdominal pain: Secondary | ICD-10-CM | POA: Diagnosis not present

## 2019-03-18 MED ORDER — METRONIDAZOLE 250 MG PO TABS: 250.0000 mg | ORAL_TABLET | Freq: Three times a day (TID) | ORAL | 0 refills | Status: DC

## 2019-03-18 MED ORDER — CIPROFLOXACIN HCL 500 MG PO TABS: 500.0000 mg | ORAL_TABLET | Freq: Two times a day (BID) | ORAL | 0 refills | Status: DC

## 2019-03-18 NOTE — Telephone Encounter (Signed)
Spoke with pt and he is aware. Scripts sent to pharmacy. 

## 2019-03-18 NOTE — Telephone Encounter (Signed)
Pt states he started having some pain under his rib cage but now it is in her LLQ. States the discomfort started on Sunday. No fever, when he drinks carbonated beverages it feels worse. Has a little constipation. When he is sitting and stands up he feels the pain in LLQ more. Last night rated the pain at a 6 on scale of 1-10, right now it is a 2. Please advise.

## 2019-03-18 NOTE — Telephone Encounter (Signed)
Possible diverticulitis, had recent colonoscopy showing diverticulosis Would treat with cipro 500 mg BID and metronidazole 250 mg TID x 7 days He should call if worsening or not completely better at abx end

## 2019-03-19 ENCOUNTER — Telehealth: Payer: Self-pay | Admitting: Internal Medicine

## 2019-03-19 NOTE — Telephone Encounter (Signed)
Left message for pt to call back  °

## 2019-03-19 NOTE — Telephone Encounter (Signed)
Pt reported that he is having a diverticulitis attack and would like to discuss diet and medication.

## 2019-03-20 DIAGNOSIS — R591 Generalized enlarged lymph nodes: Secondary | ICD-10-CM | POA: Diagnosis not present

## 2019-03-20 NOTE — Telephone Encounter (Signed)
Patient did not return call.

## 2019-03-22 ENCOUNTER — Telehealth: Payer: Self-pay | Admitting: Gastroenterology

## 2019-03-22 NOTE — Telephone Encounter (Signed)
Patient called today stating that he is on antibiotics for diverticulitis for the past couple of days.  Had a small bowel movement yesterday, but not passing much stool.  Recommended taking 2 doses of MiraLAX now and can take another dose or 2 this evening if still no bowel movement.  Pain is improving somewhat, currently has pain about 3 out of 10 on the pain scale.  Asked about pain reliever and recommended Tylenol with alternating doses of ibuprofen for the next day or 2 if needed.  He also asked about diet I recommended lots of fluids and a soft/bland/low fiber diet avoiding fruits with skins, vegetables, salads and other roughage.

## 2019-03-23 ENCOUNTER — Telehealth: Payer: Self-pay | Admitting: Internal Medicine

## 2019-03-23 NOTE — Telephone Encounter (Signed)
Pt states that he not feeling well since he had colonoscopy, he would like some advise.

## 2019-03-23 NOTE — Telephone Encounter (Signed)
Pt states he called yesterday and spoke with someone regarding being constipated. He was told to take miralax and has had a BM. Reports he is not himself, still not feeling well. Reports he is still having LLQ discomfort and "just not back to normal." Pt scheduled to see Ellouise Newer PA 03/27/19@10 :30am and pt added to cancellation list.

## 2019-03-27 ENCOUNTER — Ambulatory Visit: Payer: BC Managed Care – PPO | Admitting: Physician Assistant

## 2019-04-02 ENCOUNTER — Ambulatory Visit: Payer: BC Managed Care – PPO | Admitting: Physician Assistant

## 2019-04-07 DIAGNOSIS — F4321 Adjustment disorder with depressed mood: Secondary | ICD-10-CM | POA: Diagnosis not present

## 2019-04-10 DIAGNOSIS — G4733 Obstructive sleep apnea (adult) (pediatric): Secondary | ICD-10-CM | POA: Diagnosis not present

## 2019-05-04 ENCOUNTER — Encounter: Payer: Self-pay | Admitting: *Deleted

## 2019-05-05 ENCOUNTER — Encounter: Payer: Self-pay | Admitting: Internal Medicine

## 2019-05-05 ENCOUNTER — Ambulatory Visit (INDEPENDENT_AMBULATORY_CARE_PROVIDER_SITE_OTHER): Payer: BC Managed Care – PPO | Admitting: Internal Medicine

## 2019-05-05 VITALS — BP 130/78 | Temp 98.3°F | Ht 66.0 in | Wt 221.8 lb

## 2019-05-05 DIAGNOSIS — K5732 Diverticulitis of large intestine without perforation or abscess without bleeding: Secondary | ICD-10-CM

## 2019-05-05 DIAGNOSIS — Z86010 Personal history of colonic polyps: Secondary | ICD-10-CM

## 2019-05-05 DIAGNOSIS — K5900 Constipation, unspecified: Secondary | ICD-10-CM

## 2019-05-05 DIAGNOSIS — F4321 Adjustment disorder with depressed mood: Secondary | ICD-10-CM | POA: Diagnosis not present

## 2019-05-05 MED ORDER — DICYCLOMINE HCL 20 MG PO TABS: 20.0000 mg | ORAL_TABLET | Freq: Three times a day (TID) | ORAL | 1 refills | Status: DC | PRN

## 2019-05-05 NOTE — Progress Notes (Signed)
Patient ID: [name], male   DOB: 1961-06-03, 58 y.o.   MRN: [id number] HPI: [name] is a 58 year old male with history of adenomatous colon polyps, diverticulosis in the sigmoid with recent diverticulitis, hyperlipidemia, sleep apnea who seen for follow-up.  He is here alone today.  He came for surveillance colonoscopy with me on 03/04/2019.  This revealed a 3 mm cecal adenoma and an 8 mm descending colon adenoma which noted.  There were diverticulosis in the sigmoid.  The rest exam of the exam was unremarkable.  About 2 weeks later he developed pain in the left lower abdomen which was associated with constipation but also low-grade fever.  This progressed to about a 6 out of 10.  I started him on ciprofloxacin and metronidazole for presumed diverticulitis x7 days.  He reports the antibiotics definitively helped his abdominal pain.  He has had just some minor soreness in the left lower quadrant and left upper quadrant since.  This is been very mild about 1 out of 10 when present.  Separate from this he has noticed some constipation and he recalls now that this was present before his colonoscopy.  His wife reminded him that he was excited for the colon prep because he needed a "good cleanout".  He has put himself on MiraLAX which he is using 1-2 doses a day and for the last several days he has had more complete bowel movements which he has found relieving.  No blood in his stool or melena.    Past Medical History:  Diagnosis Date  . Anxiety   . Arthritis   . Cataract    removed with surgery  . Diverticulosis   . HA (headache)   . Hyperlipemia   . Sleep apnea    Uses CPAP nightly  . Tubular adenoma of colon     Past Surgical History:  Procedure Laterality Date  . ADENOIDECTOMY    . ANKLE ARTHROSCOPY Left    x2  . CATARACT EXTRACTION Bilateral   . COLONOSCOPY     polyps  . KNEE ARTHROSCOPY Left    x2  . MOUTH SURGERY    . NASAL SEPTUM SURGERY    . right leg surgery      fx fib/tib with steel rod from injury   . SALIVARY GLAND SURGERY    . TONSILLECTOMY    . TYMPANOSTOMY    . WISDOM TOOTH EXTRACTION      Outpatient Medications Prior to Visit  Medication Sig Dispense Refill  . ALPRAZolam (XANAX) 0.25 MG tablet Take 1 tablet by mouth as directed.    Marland Kitchen LEXAPRO 20 MG tablet Take 20 mg by mouth daily.     . Multiple Vitamin (MULTIVITAMIN) tablet Take 1 tablet by mouth daily.    . naproxen (NAPROSYN) 500 MG tablet Take 1 tablet by mouth as directed.    . Omega-3 Fatty Acids (FISH OIL CONCENTRATE PO) Take 2 capsules by mouth daily.    Marland Kitchen OVER THE COUNTER MEDICATION Take 2 capsules by mouth daily. Vitamind    . rosuvastatin (CRESTOR) 10 MG tablet Take 1 tablet by mouth daily.    . sildenafil (VIAGRA) 100 MG tablet Take 1 tablet by mouth as directed.    Marland Kitchen tiZANidine (ZANAFLEX) 2 MG tablet Take 1 tablet by mouth as directed.    . ciprofloxacin (CIPRO) 500 MG tablet Take 1 tablet (500 mg total) by mouth 2 (two) times daily. (Patient not taking: Reported on 05/05/2019) 14 tablet 0  . metroNIDAZOLE (  FLAGYL) 250 MG tablet Take 1 tablet (250 mg total) by mouth 3 (three) times daily. (Patient not taking: Reported on 05/05/2019) 21 tablet 0   No facility-administered medications prior to visit.     Allergies  Allergen Reactions  . Keflex [Cephalexin] Other (See Comments)    abd burning  And pain and diarrhea    Family History  Problem Relation Age of Onset  . Cancer - Other Sister   . Cancer - Other Maternal Grandfather   . Heart disease Mother   . Diabetes Father   . Heart disease Father   . Diabetes Maternal Aunt   . Diabetes Maternal Grandmother   . Colon cancer Neg Hx   . Colon polyps Neg Hx   . Esophageal cancer Neg Hx   . Rectal cancer Neg Hx   . Stomach cancer Neg Hx     Social History   Tobacco Use  . Smoking status: Current Some Day Smoker    Years: 40.00    Types: Cigarettes  . Smokeless tobacco: Never Used  . Tobacco comment: smokes 1  pk /wk  Substance Use Topics  . Alcohol use: Yes    Alcohol/week: 6.0 standard drinks    Types: 6 Glasses of wine per week  . Drug use: No    ROS: As per history of present illness, otherwise negative  BP 130/78 (BP Location: Left Arm, Patient Position: Sitting)   Temp 98.3 F (36.8 C)   Ht 5\' 6"  (1.676 m)   Wt 221 lb 12.8 oz (100.6 kg)   SpO2 98%   BMI 35.80 kg/m  Gen: awake, alert, NAD HEENT: anicteric, op clear CV: RRR, no mrg Pulm: CTA b/l Abd: soft, very mild tenderness in the left lower quadrant left upper quadrant without rebound or guarding, nondistended, +BS throughout Ext: no c/c/e Neuro: nonfocal   RELEVANT LABS AND IMAGING: CBC    Component Value Date/Time   WBC 9.4 10/16/2016 1559   RBC 4.63 10/16/2016 1559   HGB 14.5 10/16/2016 1559   HCT 43.1 10/16/2016 1559   PLT 131 (L) 10/16/2016 1559   MCV 93.1 10/16/2016 1559   MCH 31.3 10/16/2016 1559   MCHC 33.6 10/16/2016 1559   RDW 13.6 10/16/2016 1559   LYMPHSABS 1.3 10/16/2016 1559   MONOABS 0.6 10/16/2016 1559   EOSABS 0.3 10/16/2016 1559   BASOSABS 0.0 10/16/2016 1559    CMP     Component Value Date/Time   NA 135 10/16/2016 1559   K 3.8 10/16/2016 1559   CL 101 10/16/2016 1559   CO2 25 10/16/2016 1559   GLUCOSE 96 10/16/2016 1559   BUN 11 10/16/2016 1559   CREATININE 0.93 10/16/2016 1559   CALCIUM 9.1 10/16/2016 1559   PROT 7.4 10/16/2016 1559   ALBUMIN 4.3 10/16/2016 1559   AST 21 10/16/2016 1559   ALT 22 10/16/2016 1559   ALKPHOS 59 10/16/2016 1559   BILITOT 0.9 10/16/2016 1559   GFRNONAA >60 10/16/2016 1559   GFRAA >60 10/16/2016 1559    ASSESSMENT/PLAN:  58 year old male with history of adenomatous colon polyps, diverticulosis in the sigmoid with recent diverticulitis, hyperlipidemia, sleep apnea who seen for follow-up.  1.  Resolved diverticulitis/constipation --we discussed the pathophysiology of diverticulitis today.  It is possible that colonoscopy precipitated his  diverticulitis but we discussed how this can happen seemingly at random.  He did definitely respond to antibiotics.  I think constipation, preceding colonoscopy is also exacerbating some of his abdominal discomfort.  I have recommended the  following --Continue MiraLAX 17 g 1-2 times a day; if constipation persists or incomplete evacuation and we could consider a prescription laxative such as Amitiza or Linzess.  He should let me know if he fails to have regular bowel movements --Florastor is recommended 250 mg twice daily for a month after recent colonoscopy but also diverticulitis requiring antibiotics --I will prescribe Bentyl 20 mg to be used 3 times daily as needed for crampy abdominal discomfort  He is asked to call me if he has worsening of his abdominal pain or if it fails to improve over the coming weeks  2.  History of adenomatous colon polyp --surveillance colonoscopy recommended in 5 years given his history of multiple adenomatous colon polyps  25 minutes spent with the patient today. Greater than 50% was spent in counseling and coordination of care with the patient   [id number], Dwyane Luo, [place],  [place] [address]

## 2019-05-05 NOTE — Patient Instructions (Signed)
Continue Miralax 17 grams (1 capful) once daily.  Please purchase the following medications over the counter and take as directed: Florastor 250 mg twice daily x 1 month  We have sent the following medications to your pharmacy for you to pick up at your convenience: Bentyl 20 three times daily as needed  Call our office if your abdominal discomfort does not improve.  If you are age 60 or older, your body mass index should be between 23-30. Your Body mass index is 35.8 kg/m. If this is out of the aforementioned range listed, please consider follow up with your Primary Care Provider.  If you are age 65 or younger, your body mass index should be between 19-25. Your Body mass index is 35.8 kg/m. If this is out of the aformentioned range listed, please consider follow up with your Primary Care Provider.

## 2019-05-22 ENCOUNTER — Other Ambulatory Visit: Payer: Self-pay

## 2019-05-22 DIAGNOSIS — Z20822 Contact with and (suspected) exposure to covid-19: Secondary | ICD-10-CM

## 2019-05-23 LAB — NOVEL CORONAVIRUS, NAA: SARS-CoV-2, NAA: NOT DETECTED

## 2019-05-27 DIAGNOSIS — M25512 Pain in left shoulder: Secondary | ICD-10-CM | POA: Diagnosis not present

## 2019-06-02 DIAGNOSIS — F4321 Adjustment disorder with depressed mood: Secondary | ICD-10-CM | POA: Diagnosis not present

## 2019-06-08 DIAGNOSIS — M25512 Pain in left shoulder: Secondary | ICD-10-CM | POA: Diagnosis not present

## 2019-06-15 DIAGNOSIS — M25512 Pain in left shoulder: Secondary | ICD-10-CM | POA: Diagnosis not present

## 2019-06-16 DIAGNOSIS — G4730 Sleep apnea, unspecified: Secondary | ICD-10-CM | POA: Diagnosis not present

## 2019-06-16 DIAGNOSIS — M2550 Pain in unspecified joint: Secondary | ICD-10-CM | POA: Diagnosis not present

## 2019-06-16 DIAGNOSIS — F419 Anxiety disorder, unspecified: Secondary | ICD-10-CM | POA: Diagnosis not present

## 2019-06-16 DIAGNOSIS — F4310 Post-traumatic stress disorder, unspecified: Secondary | ICD-10-CM | POA: Diagnosis not present

## 2019-06-19 DIAGNOSIS — M25512 Pain in left shoulder: Secondary | ICD-10-CM | POA: Diagnosis not present

## 2019-06-19 DIAGNOSIS — M75122 Complete rotator cuff tear or rupture of left shoulder, not specified as traumatic: Secondary | ICD-10-CM | POA: Diagnosis not present

## 2019-07-07 DIAGNOSIS — F4321 Adjustment disorder with depressed mood: Secondary | ICD-10-CM | POA: Diagnosis not present

## 2019-07-13 DIAGNOSIS — M7552 Bursitis of left shoulder: Secondary | ICD-10-CM | POA: Diagnosis not present

## 2019-07-13 DIAGNOSIS — G8918 Other acute postprocedural pain: Secondary | ICD-10-CM | POA: Diagnosis not present

## 2019-07-13 DIAGNOSIS — S43432A Superior glenoid labrum lesion of left shoulder, initial encounter: Secondary | ICD-10-CM | POA: Diagnosis not present

## 2019-07-13 DIAGNOSIS — S46012A Strain of muscle(s) and tendon(s) of the rotator cuff of left shoulder, initial encounter: Secondary | ICD-10-CM | POA: Diagnosis not present

## 2019-07-13 DIAGNOSIS — M7542 Impingement syndrome of left shoulder: Secondary | ICD-10-CM | POA: Diagnosis not present

## 2019-07-13 DIAGNOSIS — M24112 Other articular cartilage disorders, left shoulder: Secondary | ICD-10-CM | POA: Diagnosis not present

## 2019-07-13 DIAGNOSIS — M19012 Primary osteoarthritis, left shoulder: Secondary | ICD-10-CM | POA: Diagnosis not present

## 2019-07-13 DIAGNOSIS — X58XXXA Exposure to other specified factors, initial encounter: Secondary | ICD-10-CM | POA: Diagnosis not present

## 2019-07-13 DIAGNOSIS — X500XXA Overexertion from strenuous movement or load, initial encounter: Secondary | ICD-10-CM | POA: Diagnosis not present

## 2019-07-13 DIAGNOSIS — M7522 Bicipital tendinitis, left shoulder: Secondary | ICD-10-CM | POA: Diagnosis not present

## 2019-07-27 DIAGNOSIS — M25512 Pain in left shoulder: Secondary | ICD-10-CM | POA: Diagnosis not present

## 2019-08-11 DIAGNOSIS — F4321 Adjustment disorder with depressed mood: Secondary | ICD-10-CM | POA: Diagnosis not present

## 2019-08-13 DIAGNOSIS — M25512 Pain in left shoulder: Secondary | ICD-10-CM | POA: Diagnosis not present

## 2019-08-18 DIAGNOSIS — M25512 Pain in left shoulder: Secondary | ICD-10-CM | POA: Diagnosis not present

## 2019-08-20 DIAGNOSIS — M25512 Pain in left shoulder: Secondary | ICD-10-CM | POA: Diagnosis not present

## 2019-08-24 DIAGNOSIS — M25512 Pain in left shoulder: Secondary | ICD-10-CM | POA: Diagnosis not present

## 2019-08-26 DIAGNOSIS — M25512 Pain in left shoulder: Secondary | ICD-10-CM | POA: Diagnosis not present

## 2019-08-31 DIAGNOSIS — M25512 Pain in left shoulder: Secondary | ICD-10-CM | POA: Diagnosis not present

## 2019-09-07 DIAGNOSIS — M25512 Pain in left shoulder: Secondary | ICD-10-CM | POA: Diagnosis not present

## 2019-09-10 DIAGNOSIS — M25512 Pain in left shoulder: Secondary | ICD-10-CM | POA: Diagnosis not present

## 2019-09-10 DIAGNOSIS — F4321 Adjustment disorder with depressed mood: Secondary | ICD-10-CM | POA: Diagnosis not present

## 2019-09-15 DIAGNOSIS — M25512 Pain in left shoulder: Secondary | ICD-10-CM | POA: Diagnosis not present

## 2019-09-17 DIAGNOSIS — M25512 Pain in left shoulder: Secondary | ICD-10-CM | POA: Diagnosis not present

## 2019-09-18 DIAGNOSIS — H52203 Unspecified astigmatism, bilateral: Secondary | ICD-10-CM | POA: Diagnosis not present

## 2019-09-18 DIAGNOSIS — Z961 Presence of intraocular lens: Secondary | ICD-10-CM | POA: Diagnosis not present

## 2019-09-22 DIAGNOSIS — M25512 Pain in left shoulder: Secondary | ICD-10-CM | POA: Diagnosis not present

## 2019-09-24 DIAGNOSIS — M25512 Pain in left shoulder: Secondary | ICD-10-CM | POA: Diagnosis not present

## 2019-09-29 DIAGNOSIS — M25512 Pain in left shoulder: Secondary | ICD-10-CM | POA: Diagnosis not present

## 2019-10-01 DIAGNOSIS — M25512 Pain in left shoulder: Secondary | ICD-10-CM | POA: Diagnosis not present

## 2019-10-06 DIAGNOSIS — M25512 Pain in left shoulder: Secondary | ICD-10-CM | POA: Diagnosis not present

## 2019-10-08 DIAGNOSIS — M25512 Pain in left shoulder: Secondary | ICD-10-CM | POA: Diagnosis not present

## 2019-10-12 DIAGNOSIS — M25512 Pain in left shoulder: Secondary | ICD-10-CM | POA: Diagnosis not present

## 2019-10-16 DIAGNOSIS — M25512 Pain in left shoulder: Secondary | ICD-10-CM | POA: Diagnosis not present

## 2019-10-20 DIAGNOSIS — M25512 Pain in left shoulder: Secondary | ICD-10-CM | POA: Diagnosis not present

## 2019-10-22 DIAGNOSIS — M25512 Pain in left shoulder: Secondary | ICD-10-CM | POA: Diagnosis not present

## 2019-10-28 DIAGNOSIS — M25512 Pain in left shoulder: Secondary | ICD-10-CM | POA: Diagnosis not present

## 2019-10-30 DIAGNOSIS — M25512 Pain in left shoulder: Secondary | ICD-10-CM | POA: Diagnosis not present

## 2019-11-03 DIAGNOSIS — M25512 Pain in left shoulder: Secondary | ICD-10-CM | POA: Diagnosis not present

## 2019-11-05 DIAGNOSIS — M25512 Pain in left shoulder: Secondary | ICD-10-CM | POA: Diagnosis not present

## 2019-11-10 DIAGNOSIS — M25512 Pain in left shoulder: Secondary | ICD-10-CM | POA: Diagnosis not present

## 2019-11-12 DIAGNOSIS — M25512 Pain in left shoulder: Secondary | ICD-10-CM | POA: Diagnosis not present

## 2019-11-17 DIAGNOSIS — M25512 Pain in left shoulder: Secondary | ICD-10-CM | POA: Diagnosis not present

## 2019-11-19 DIAGNOSIS — M25512 Pain in left shoulder: Secondary | ICD-10-CM | POA: Diagnosis not present

## 2019-11-30 DIAGNOSIS — F4321 Adjustment disorder with depressed mood: Secondary | ICD-10-CM | POA: Diagnosis not present

## 2019-12-03 DIAGNOSIS — M25512 Pain in left shoulder: Secondary | ICD-10-CM | POA: Diagnosis not present

## 2019-12-08 DIAGNOSIS — M25512 Pain in left shoulder: Secondary | ICD-10-CM | POA: Diagnosis not present

## 2019-12-16 ENCOUNTER — Other Ambulatory Visit: Payer: Self-pay | Admitting: Family Medicine

## 2019-12-16 ENCOUNTER — Encounter: Payer: Self-pay | Admitting: Internal Medicine

## 2019-12-16 DIAGNOSIS — R1011 Right upper quadrant pain: Secondary | ICD-10-CM

## 2020-01-04 ENCOUNTER — Other Ambulatory Visit: Payer: BC Managed Care – PPO

## 2020-01-21 ENCOUNTER — Ambulatory Visit
Admission: RE | Admit: 2020-01-21 | Discharge: 2020-01-21 | Disposition: A | Discharge status: Home / Self Care | Payer: 59 | Source: Ambulatory Visit | Attending: Family Medicine | Admitting: Family Medicine

## 2020-01-21 ENCOUNTER — Other Ambulatory Visit: Payer: Self-pay | Admitting: Family Medicine

## 2020-01-21 DIAGNOSIS — R1011 Right upper quadrant pain: Secondary | ICD-10-CM

## 2020-01-21 DIAGNOSIS — K4000 Bilateral inguinal hernia, with obstruction, without gangrene, not specified as recurrent: Secondary | ICD-10-CM

## 2020-01-27 ENCOUNTER — Ambulatory Visit
Admission: RE | Admit: 2020-01-27 | Discharge: 2020-01-27 | Disposition: A | Discharge status: Home / Self Care | Payer: 59 | Source: Ambulatory Visit | Attending: Family Medicine | Admitting: Family Medicine

## 2020-01-27 ENCOUNTER — Other Ambulatory Visit: Payer: Self-pay

## 2020-01-27 DIAGNOSIS — K4000 Bilateral inguinal hernia, with obstruction, without gangrene, not specified as recurrent: Secondary | ICD-10-CM

## 2020-02-03 ENCOUNTER — Telehealth: Payer: Self-pay | Admitting: Internal Medicine

## 2020-02-03 NOTE — Telephone Encounter (Signed)
Patient aware and will be here for appointment tomorrow.

## 2020-02-03 NOTE — Telephone Encounter (Signed)
Scheduled pt to see Ellouise Newer PA tomorrow at 11:30am. Please let pt know about appt.

## 2020-02-04 ENCOUNTER — Encounter: Payer: Self-pay | Admitting: Physician Assistant

## 2020-02-04 ENCOUNTER — Ambulatory Visit (INDEPENDENT_AMBULATORY_CARE_PROVIDER_SITE_OTHER): Payer: 59 | Admitting: Physician Assistant

## 2020-02-04 VITALS — BP 152/80 | HR 80 | Ht 66.0 in | Wt 218.8 lb

## 2020-02-04 DIAGNOSIS — K429 Umbilical hernia without obstruction or gangrene: Secondary | ICD-10-CM

## 2020-02-04 DIAGNOSIS — M940 Chondrocostal junction syndrome [Tietze]: Secondary | ICD-10-CM

## 2020-02-04 DIAGNOSIS — M6208 Separation of muscle (nontraumatic), other site: Secondary | ICD-10-CM | POA: Diagnosis not present

## 2020-02-04 DIAGNOSIS — K760 Fatty (change of) liver, not elsewhere classified: Secondary | ICD-10-CM | POA: Diagnosis not present

## 2020-02-04 NOTE — Progress Notes (Signed)
Chief Complaint: Right upper quadrant pain, epigastric pain and bloating  HPI:    Mr. [name] is a 59 year old male with a past medical history as listed below including adenomatous colon polyps, diverticulosis in the sigmoid, sleep apnea and hyperlipidemia,, known to Dr. Hilarie Fredrickson, who was referred to me by Lawerance Cruel, MD for a complaint of right upper quadrant pain, epigastric pain and bloating.      03/04/2019 surveillance colonoscopy with a 3 mm cecal adenoma and 8 mm descending colon adenoma as well as diverticulosis in the sigmoid.    05/05/2019 office visit with Dr. Hilarie Fredrickson.  Described lower abdominal pain with constipation and low-grade fever.  He was doing better after antibiotic treatment for diverticulitis.  He is continued on MiraLAX and start on Florastor as well as given Bentyl.  Repeat colonoscopy was recommended in 2025.    01/21/2020 abdominal ultrasound with probable fatty infiltration of the liver, incomplete visualization of IVC, pancreas and aorta.  No acute upper abdominal sonographic abnormalities.    01/28/2020 right upper quadrant ultrasound with no evidence of hernia or other sonographic abnormality of the mid abdomen.    Today, the patient presents to clinic and explains that at the beginning of July he started with the pain mostly in his right upper quadrant which his PCP noticed at his annual visit.  He then had imaging as above.  Patient also tells me he had labs at that time and was not told anything was abnormal.  Explained that since that time the pain has really stopped in his right upper quadrant and is now just in his left upper side.  Tells me when laying on his side the other day he felt some "bumps and tenderness" under his left side.  Describes it is there most of the day rated a 4-5/10 and does not seem exacerbated by much of anything.  Denies any reflux or heartburn symptoms.    Also complains of "something that protrudes when I sit up all the way down my  abdomen" as well as some pain in his umbilicus especially when he is bending over or if it touches something, this is increased recently.    Continues on Florastor and tells me this helps to maintain regular bowel movements.  Does ask questions in regards to any finding of fatty liver.    Denies fever, chills, blood in stool, weight loss or symptoms that awaken him from sleep.  Past Medical History:  Diagnosis Date  . Anxiety   . Arthritis   . Cataract    removed with surgery  . Diverticulosis   . HA (headache)   . Hyperlipemia   . Sleep apnea    Uses CPAP nightly  . Tubular adenoma of colon     Past Surgical History:  Procedure Laterality Date  . ADENOIDECTOMY    . ANKLE ARTHROSCOPY Left    x2  . CATARACT EXTRACTION Bilateral   . COLONOSCOPY     polyps  . KNEE ARTHROSCOPY Left    x2  . MOUTH SURGERY    . NASAL SEPTUM SURGERY    . right leg surgery     fx fib/tib with steel rod from injury   . SALIVARY GLAND SURGERY    . TONSILLECTOMY    . TYMPANOSTOMY    . WISDOM TOOTH EXTRACTION      Current Outpatient Medications  Medication Sig Dispense Refill  . ALPRAZolam (XANAX) 0.25 MG tablet Take 1 tablet by mouth as directed.    Marland Kitchen  dicyclomine (BENTYL) 20 MG tablet Take 1 tablet (20 mg total) by mouth 3 (three) times daily as needed for spasms. 90 tablet 1  . LEXAPRO 20 MG tablet Take 20 mg by mouth daily.     . Multiple Vitamin (MULTIVITAMIN) tablet Take 1 tablet by mouth daily.    . naproxen (NAPROSYN) 500 MG tablet Take 1 tablet by mouth as directed.    . Omega-3 Fatty Acids (FISH OIL CONCENTRATE PO) Take 2 capsules by mouth daily.    Marland Kitchen OVER THE COUNTER MEDICATION Take 2 capsules by mouth daily. Vitamind    . rosuvastatin (CRESTOR) 10 MG tablet Take 1 tablet by mouth daily.    . sildenafil (VIAGRA) 100 MG tablet Take 1 tablet by mouth as directed.    Marland Kitchen tiZANidine (ZANAFLEX) 2 MG tablet Take 1 tablet by mouth as directed.     No current facility-administered medications  for this visit.    Allergies as of 02/04/2020 - Review Complete 05/05/2019  Allergen Reaction Noted  . Keflex [cephalexin] Other (See Comments) 07/01/2015    Family History  Problem Relation Age of Onset  . Cancer - Other Sister   . Cancer - Other Maternal Grandfather   . Heart disease Mother   . Diabetes Father   . Heart disease Father   . Diabetes Maternal Aunt   . Diabetes Maternal Grandmother   . Colon cancer Neg Hx   . Colon polyps Neg Hx   . Esophageal cancer Neg Hx   . Rectal cancer Neg Hx   . Stomach cancer Neg Hx     Social History   Socioeconomic History  . Marital status: Married    Spouse name: Not on file  . Number of children: Not on file  . Years of education: Not on file  . Highest education level: Not on file  Occupational History  . Not on file  Tobacco Use  . Smoking status: Current Some Day Smoker    Years: 40.00    Types: Cigarettes  . Smokeless tobacco: Never Used  . Tobacco comment: smokes 1 pk /wk  Vaping Use  . Vaping Use: Never used  Substance and Sexual Activity  . Alcohol use: Yes    Alcohol/week: 6.0 standard drinks    Types: 6 Glasses of wine per week  . Drug use: No  . Sexual activity: Not on file  Other Topics Concern  . Not on file  Social History Narrative  . Not on file   Social Determinants of Health   Financial Resource Strain:   . Difficulty of Paying Living Expenses: Not on file  Food Insecurity:   . Worried About Charity fundraiser in the Last Year: Not on file  . Ran Out of Food in the Last Year: Not on file  Transportation Needs:   . Lack of Transportation (Medical): Not on file  . Lack of Transportation (Non-Medical): Not on file  Physical Activity:   . Days of Exercise per Week: Not on file  . Minutes of Exercise per Session: Not on file  Stress:   . Feeling of Stress : Not on file  Social Connections:   . Frequency of Communication with Friends and Family: Not on file  . Frequency of Social Gatherings  with Friends and Family: Not on file  . Attends Religious Services: Not on file  . Active Member of Clubs or Organizations: Not on file  . Attends Archivist Meetings: Not on file  . Marital  Status: Not on file  Intimate Partner Violence:   . Fear of Current or Ex-Partner: Not on file  . Emotionally Abused: Not on file  . Physically Abused: Not on file  . Sexually Abused: Not on file    Review of Systems:    Constitutional: No weight loss, fever or chills Cardiovascular: No chest pain Respiratory: No SOB  Gastrointestinal: See HPI and otherwise negative   Physical Exam:  Vital signs: BP (!) 152/80   Pulse 80   Ht 5\' 6"  (1.676 m)   Wt 218 lb 12.8 oz (99.2 kg)   BMI 35.32 kg/m   Constitutional:   Pleasant overweight Caucasian male appears to be in NAD, Well developed, Well nourished, alert and cooperative Head:  Normocephalic and atraumatic. Eyes:   PEERL, EOMI. No icterus. Conjunctiva pink. Ears:  Normal auditory acuity. Neck:  Supple Throat: Oral cavity and pharynx without inflammation, swelling or lesion.  Respiratory: Respirations even and unlabored. Lungs clear to auscultation bilaterally.   No wheezes, crackles, or rhonchi.  Cardiovascular: Normal S1, S2. No MRG. Regular rate and rhythm. No peripheral edema, cyanosis or pallor.  Gastrointestinal:  Soft, nondistended, nontender. No rebound or guarding. Normal bowel sounds. No appreciable masses or hepatomegaly.  + Diastases recti + umbilical hernia, TTP Rectal:  Not performed.  Msk:  Symmetrical without gross deformities. Without edema, no deformity or joint abnormality. + Pain over lower left-sided ribs Neurologic:  Alert and  oriented x4;  grossly normal neurologically.  Skin:   Dry and intact without significant lesions or rashes. Psychiatric: Oriented to person, place and time. Demonstrates good judgement and reason without abnormal affect or behaviors.  Requesting recent labs.  Assessment: 1.   Costochondritis/musculoskeletal pain: Abdominal pain is not abdominal in origin, it is musculoskeletal 2.  Diastases recti 3.  Umbilical hernia: Tender to palpation, easily reducible 4.  Fatty liver: Seen on recent ultrasound  Plan: 1.  Discussed with the patient that his pain that was felt on exam today is musculoskeletal, likely costochondritis or other from him pulling something heavy or coughing too hard etc.  Told him this can take some time to feel better sometimes up to 2 to 3 months.  Recommend he put a heating pad on this area to help. 2.  Discussed diastases recti, this is a benign change typically with aging. 3.  Also discussed umbilical hernia, this is painful at times for the patient, referred him to [facility] for their recommendations. 4.  Requested records from recent PCP visit. 5.  Discussed a slow and steady weight loss of 1 to 2 pounds per week.  Asked him to stop any hepatotoxic substances.   6.  Patient to follow in clinic with [place] as needed in the future.  Ellouise Newer, PA-C [facility] 02/04/2020, 11:39 AM  Addendum: 02/04/2020 1403  Received labs from PCP.  CBC with platelets low at 125 and otherwise normal, CMP with a glucose elevated at 108 and otherwise normal.  (With a cholesterol high at 236, triglycerides high at 365, vitamin D low at 29.3. Cc: Lawerance Cruel, MD

## 2020-02-04 NOTE — Patient Instructions (Signed)
We have requested your recent labs from Dr Lona Kettle.  You have been scheduled for an appointment with _____________ at [facility]. Your appointment is on ____________ at _____________. Please arrive at ___________________ for registration. Make certain to bring a list of current medications, including any over the counter medications or vitamins. Also bring your co-pay if you have one as well as your insurance cards. [facility] is located at [address], [address]. Should you need to reschedule your appointment, please contact them at [phone].  Please follow up with our office as needed.  If you are age 61 or older, your body mass index should be between 23-30. Your Body mass index is 35.32 kg/m. If this is out of the aforementioned range listed, please consider follow up with your Primary Care Provider.  If you are age 61 or younger, your body mass index should be between 19-25. Your Body mass index is 35.32 kg/m. If this is out of the aformentioned range listed, please consider follow up with your Primary Care Provider.   Due to recent changes in healthcare laws, you may see the results of your imaging and laboratory studies on MyChart before your provider has had a chance to review them.  We understand that in some cases there may be results that are confusing or concerning to you. Not all laboratory results come back in the same time frame and the provider may be waiting for multiple results in order to interpret others.  Please give [place] 48 hours in order for your provider to thoroughly review all the results before contacting the office for clarification of your results.

## 2020-02-05 ENCOUNTER — Telehealth: Payer: Self-pay | Admitting: *Deleted

## 2020-02-05 NOTE — Telephone Encounter (Signed)
Faxed 20 pages of records to [facility] in referral for possible umbilical hernia repair.

## 2020-02-08 NOTE — Progress Notes (Signed)
Addendum: Reviewed and agree with assessment and management plan. Markham Dumlao M, MD  

## 2020-02-12 NOTE — Telephone Encounter (Signed)
I have spoken to [place] (Teaching laboratory technician at [facility]). She wanted to confirm patient needs to be seen for umbilical hernia. She noticed that patient u/s showed no hernia while our note indicates he needs treatment for hernia and diathesis recti. She notes that diathesis recti would involve plastic surgery while umbilical hernia could be repaired by general surgery. I advised that Ellouise Newer, PA-C did note umbilical hernia as well as diathesis recti on exam. [place] will contact patient to discuss umbilical hernia consult.

## 2020-02-12 NOTE — Telephone Encounter (Signed)
[place] from [facility] is requesting a call back from a nurse to discuss referral  [phone]

## 2020-02-16 NOTE — Telephone Encounter (Signed)
Per [facility], patient has been advised of appointment with Dr Michaelle Birks on 02/26/20 at 11:30 am.

## 2020-03-01 ENCOUNTER — Ambulatory Visit: Payer: 59 | Admitting: Cardiology

## 2020-03-29 ENCOUNTER — Ambulatory Visit: Payer: Self-pay | Admitting: Cardiology

## 2020-04-29 ENCOUNTER — Ambulatory Visit: Payer: 59 | Admitting: Cardiology

## 2020-06-29 ENCOUNTER — Ambulatory Visit: Payer: 59 | Admitting: Cardiology

## 2020-06-29 NOTE — Progress Notes (Incomplete)
No show

## 2020-09-06 ENCOUNTER — Ambulatory Visit: Payer: 59 | Admitting: Cardiology

## 2020-10-17 ENCOUNTER — Ambulatory Visit: Payer: 59 | Admitting: Cardiology

## 2020-11-11 ENCOUNTER — Ambulatory Visit: Payer: 59 | Admitting: Adult Health

## 2021-01-09 ENCOUNTER — Encounter (HOSPITAL_BASED_OUTPATIENT_CLINIC_OR_DEPARTMENT_OTHER): Payer: Self-pay | Admitting: Cardiology

## 2021-01-09 ENCOUNTER — Ambulatory Visit (INDEPENDENT_AMBULATORY_CARE_PROVIDER_SITE_OTHER): Payer: 59 | Admitting: Cardiology

## 2021-01-09 ENCOUNTER — Other Ambulatory Visit: Payer: Self-pay

## 2021-01-09 VITALS — BP 126/70 | HR 69 | Ht 67.0 in | Wt 212.0 lb

## 2021-01-09 DIAGNOSIS — E782 Mixed hyperlipidemia: Secondary | ICD-10-CM | POA: Diagnosis not present

## 2021-01-09 DIAGNOSIS — Z8249 Family history of ischemic heart disease and other diseases of the circulatory system: Secondary | ICD-10-CM

## 2021-01-09 DIAGNOSIS — Z716 Tobacco abuse counseling: Secondary | ICD-10-CM

## 2021-01-09 DIAGNOSIS — F17210 Nicotine dependence, cigarettes, uncomplicated: Secondary | ICD-10-CM

## 2021-01-09 DIAGNOSIS — R0789 Other chest pain: Secondary | ICD-10-CM

## 2021-01-09 DIAGNOSIS — E6609 Other obesity due to excess calories: Secondary | ICD-10-CM

## 2021-01-09 DIAGNOSIS — Z6833 Body mass index (BMI) 33.0-33.9, adult: Secondary | ICD-10-CM

## 2021-01-09 NOTE — Patient Instructions (Signed)

## 2021-01-09 NOTE — Progress Notes (Signed)
Cardiology Office Note:    Date:  01/09/2021   ID:  [name], DOB March 12, 1961, MRN [id number]  PCP:  Eric Cruel, MD  Cardiologist:  Eric Dresser, MD PhD  Referring MD: Eric Cruel, MD   CC: Follow-up  History of Present Illness:    [name] is a 60 y.o. male with a hx of OSA on CPAP, mixed hyperlipidemia, obesity who is seen for follow-up. I initially met him 02/18/2019 as a new consult at the request of Eric Cruel, MD for the evaluation and management of family history of cardiovascular disease.  Cardiovascular risk factors: Comorbid conditions: hyperlipidemia. Thinks his highest cholesterol was 247 (Tchol). First told in his late 11s that he has high cholesterol.  Started rosuvastatin about 6 weeks ago, started on 20 mg but felt weird. Doing well on 10 mg dose. Was on fish oil before. Current smoker, working on cessation Family history: Three sisters; one sister with high cholesterol. One sister with nasal cancer, other sister with Lewy Body dementia. Mother had high cholesterol, died age 15 (assumed MI); has stroke when she was 31. Father died age 25 of MI, had a stroke age 70-60. Only remembers one grandparent, unsure of all their history. Uncle might have had MI in 72s, not sure. No definitive early CAD, no SCD.   Labs from Dr Harrington Challenger 11/13/18 (prior to statin initiation) Tchol 248, HDL 37, TG 326, LDL 146  Today: Lately, he has been more easily fatigued. He feels like "he is not rebounding fast enough." In the past month and a half he has gone through packing up a townhome and working on getting properties ready to rent. He has been very active with this, carrying boxes, going up stairs, etc. He has not been limited but feels tired after being active during the day.  In the past week, he has developed a "bump" in his central chest. During the physical exam, this was tender on palpation.  For activity, he would like to walk more, but he is  limited by his bilateral knee pain (not shortness of breath). While climbing up and down stairs he also has some left knee pain. He also swims occasionally for about 15 minutes.   For his diet, he generally eats well and avoids junk foods and fried foods.  He regularly uses a CPAP and loves to use it.  Since his last visit he has been taking different amounts of rosuvastatin because it causes myalgias at the higher dose. Last night he took 20 mg, but notes that 10 mg has been his recent baseline.  He admits to smoking a quarter-pack of cigarettes a day. He is ready to quit.  He denies any chest pain, shortness of breath, palpitations, or exertional symptoms. No headaches, lightheadedness, or syncope to report. Also has no lower extremity edema, orthopnea or PND.   Past Medical History:  Diagnosis Date   Anxiety    Arthritis    Cataract    removed with surgery   Diverticulosis    HA (headache)    Hyperlipemia    Sleep apnea    Uses CPAP nightly   Tubular adenoma of colon     Past Surgical History:  Procedure Laterality Date   ADENOIDECTOMY     ANKLE ARTHROSCOPY Left    x2   CATARACT EXTRACTION Bilateral    COLONOSCOPY     polyps   KNEE ARTHROSCOPY Left    x2   MOUTH SURGERY  NASAL SEPTUM SURGERY     right leg surgery     fx fib/tib with steel rod from injury    SALIVARY GLAND SURGERY     TONSILLECTOMY     TYMPANOSTOMY     WISDOM TOOTH EXTRACTION      Current Medications: Current Outpatient Medications on File Prior to Visit  Medication Sig   ALPRAZolam (XANAX) 0.25 MG tablet Take 1 tablet by mouth as directed.   LEXAPRO 20 MG tablet Take 20 mg by mouth daily.    Multiple Vitamin (MULTIVITAMIN) tablet Take 1 tablet by mouth daily.   naproxen (NAPROSYN) 500 MG tablet Take 1 tablet by mouth as directed.   Omega-3 Fatty Acids (FISH OIL CONCENTRATE PO) Take 2 capsules by mouth daily.   OVER THE COUNTER MEDICATION Take 2 capsules by mouth daily. Vitamind    rosuvastatin (CRESTOR) 10 MG tablet Take 1 tablet by mouth daily.   sildenafil (VIAGRA) 100 MG tablet Take 1 tablet by mouth as directed.   tadalafil (CIALIS) 5 MG tablet Take 5 mg by mouth daily as needed for erectile dysfunction.   tiZANidine (ZANAFLEX) 2 MG tablet Take 1 tablet by mouth as directed.   No current facility-administered medications on file prior to visit.     Allergies:   Keflex [cephalexin]   Social History   Tobacco Use   Smoking status: Some Days    Years: 40.00    Types: Cigarettes   Smokeless tobacco: Never   Tobacco comments:    smokes 1 pk /wk  Vaping Use   Vaping Use: Never used  Substance Use Topics   Alcohol use: Yes    Alcohol/week: 6.0 standard drinks    Types: 6 Glasses of wine per week   Drug use: No    Family History: The patient's family history includes Cancer - Other in his maternal grandfather and sister; Diabetes in his father, maternal aunt, and maternal grandmother; Heart disease in his father and mother. There is no history of Colon cancer, Colon polyps, Esophageal cancer, Rectal cancer, or Stomach cancer. Three sisters; one sister with high cholesterol. One sister with nasal cancer, other sister with Lewy Body dementia. Mother had high cholesterol, died age 15 (assumed MI); has stroke when she was 61. Father died age 63 of MI, had a stroke age 84-60. Only remembers one grandparent, unsure of all their history. Uncle might have had MI in 39s, not sure. No definitive early CAD, no SCD.  ROS:   Please see the history of present illness. (+) Fatigued/Lethargic (+) Bilateral knee pain (+) "Bump" in central chest All other systems are reviewed and negative.   EKGs/Labs/Other Studies Reviewed:    The following studies were reviewed today: No prior cardiac studies  EKG:  EKG is personally reviewed.   01/09/2021: NSR 02/18/2019: NSR  Recent Labs: No results found for requested labs within last 8760 hours.  Recent Lipid Panel No results  found for: CHOL, TRIG, HDL, CHOLHDL, VLDL, LDLCALC, LDLDIRECT  Physical Exam:    VS:  BP 126/70   Pulse 69   Ht _0  (1.702 m)   Wt 212 lb (96.2 kg)   BMI 33.20 kg/m     Wt Readings from Last 3 Encounters:  01/09/21 212 lb (96.2 kg)  02/04/20 218 lb 12.8 oz (99.2 kg)  05/05/19 221 lb 12.8 oz (100.6 kg)    GEN: Well nourished, well developed in no acute distress HEENT: Normal, moist mucous membranes NECK: No JVD CARDIAC: regular rhythm, normal  S1 and S2, no murmurs, rubs, gallops. Tender over palpation of xiphoid process without movement/popping with palpation VASCULAR: Radial and DP pulses 2+ bilaterally. No carotid bruits RESPIRATORY:  Clear to auscultation without rales, wheezing or rhonchi  ABDOMEN: Soft, non-tender, prominent Ventral hernia MUSCULOSKELETAL:  Ambulates independently SKIN: Warm and dry, no edema NEUROLOGIC:  Alert and oriented x 3. No focal neuro deficits noted. PSYCHIATRIC:  Normal affect    ASSESSMENT:    1. Mixed hyperlipidemia   2. Family history of coronary artery disease   3. Tobacco abuse counseling   4. Class 1 obesity due to excess calories without serious comorbidity with body mass index (BMI) of 33.0 to 33.9 in adult   5. Musculoskeletal chest pain     PLAN:    MSK chest pain: -tender on palpation of xiphoid process, there is a slight scoop and then prominent portion distally. Cannot feel movement/popping with palpation -discomfort is minor, but if worsens or fails to improve, recommended he contact Dr. Harrington Challenger to discuss imaging  Mixed hyperlipidemia: -labs from 09/2020 improved, discussed how to improve further with lifestyle. Already eating well, discussed activity -continue rosuvastatin, tolerates 10 mg dose but has myalgias on 20 mg dose  Tobacco cessation: The patient was counseled on tobacco cessation today for 4 minutes.  Counseling included reviewing the risks of smoking tobacco products, how it impacts the patient's current medical  diagnoses and different strategies for quitting.  Pharmacotherapy to aid in tobacco cessation was not prescribed today.   CV risk/prevention With family history of MI, high cholesterol, stroke -tobacco abuse. Counseled on cessation, below -recommend heart healthy/Mediterranean diet, with whole grains, fruits, vegetable, fish, lean meats, nuts, and olive oil. Limit salt. -with abdominal adiposity,  -recommend moderate walking, 3-5 times/week for 30-50 minutes each session. Aim for at least 150 minutes.week. Goal should be pace of 3 miles/hours, or walking 1.5 miles in 30 minutes -recommend avoidance of tobacco products. Avoid excess alcohol. -ASCVD risk score: prior to statin, 22%  Plan for follow up: 1 year or sooner PRN  Eric Dresser, MD, PhD [place]  [facility]   Medication Adjustments/Labs and Tests Ordered: Current medicines are reviewed at length with the patient today.  Concerns regarding medicines are outlined above.  Orders Placed This Encounter  Procedures   EKG 12-Lead    No orders of the defined types were placed in this encounter.   Patient Instructions  Medication Instructions:  Your Physician recommend you continue on your current medication as directed.    *If you need a refill on your cardiac medications before your next appointment, please call your pharmacy*   Lab Work: None ordered today   Testing/Procedures: None ordered today   Follow-Up: At [facility], you and your health needs are our priority.  As part of our continuing mission to provide you with exceptional heart care, we have created designated Provider Care Teams.  These Care Teams include your primary Cardiologist (physician) and Advanced Practice Providers (APPs -  Physician Assistants and Nurse Practitioners) who all work together to provide you with the care you need, when you need it.  We recommend signing up for the patient portal called "MyChart".  Sign up  information is provided on this After Visit Summary.  MyChart is used to connect with patients for Virtual Visits (Telemedicine).  Patients are able to view lab/test results, encounter notes, upcoming appointments, etc.  Non-urgent messages can be sent to your provider as well.   To learn more about what  you can do with MyChart, go to [email or web address].    Your next appointment:   1 year(s)  The format for your next appointment:   In Person  Provider:   Buford Dresser, MD     [facility],acting as a scribe for Eric Dresser, MD.,have documented all relevant documentation on the behalf of Eric Dresser, MD,as directed by  Eric Dresser, MD while in the presence of Eric Dresser, MD.  I, Eric Dresser, MD, have reviewed all documentation for this visit. The documentation on 01/09/21 for the exam, diagnosis, procedures, and orders are all accurate and complete.   Signed, Eric Dresser, MD PhD 01/09/2021 9:09 AM    [place]

## 2021-03-07 ENCOUNTER — Other Ambulatory Visit: Payer: Self-pay | Admitting: Family Medicine

## 2021-03-07 DIAGNOSIS — R9389 Abnormal findings on diagnostic imaging of other specified body structures: Secondary | ICD-10-CM

## 2021-03-15 ENCOUNTER — Ambulatory Visit
Admission: RE | Admit: 2021-03-15 | Discharge: 2021-03-15 | Disposition: A | Discharge status: Home / Self Care | Payer: 59 | Source: Ambulatory Visit | Attending: Family Medicine | Admitting: Family Medicine

## 2021-03-15 DIAGNOSIS — R9389 Abnormal findings on diagnostic imaging of other specified body structures: Secondary | ICD-10-CM

## 2021-03-16 ENCOUNTER — Other Ambulatory Visit: Payer: 59

## 2021-03-30 NOTE — Progress Notes (Signed)
[facility] [place]  ASSESSMENT / PLAN: [name] is a 60 y.o. male with an infrarenal abdominal aortic aneurysm measuring 49m  A statement from the J[employer]for Vascular Surgery and Society for Vascular Surgery estimated the annual rupture risk according to AAA diameter to be the following: 4.0 cm to 4.9 cm in diameter - 0.5% to 5%  The patient is not yet at the threshold for elective repair of the aneurysm to prevent rupture (521m.  Recommend the following to reduce the risk of major adverse cardiac / limb events.  Cessation from all tobacco products. Blood glucose control with goal A1c < 7%. Blood pressure control with goal blood pressure < 140/90 mmHg. Lipid reduction therapy with goal LDL-C <100 mg/dL. Aspirin 8181mO QD.  Atorvastatin 40-80mg PO QD (or other "high intensity" statin therapy).  Follow up in 1 year with repeat AAA duplex   CHIEF COMPLAINT: AAA  HISTORY OF PRESENT ILLNESS: [name] a [age]o. male referred to clinic for evaluation of abdominal aortic aneurysm.  This was demonstrated incidentally during work-up for cause of back pain.  He has no symptoms referable to his aneurysm.  We spent the bulk of the visit discussing the natural history of aortic aneurysm disease, the rationale for intervention at 5-1/2 cm, and the options available for intervention when the size threshold is met.  VASCULAR SURGICAL HISTORY: none  VASCULAR RISK FACTORS: Negative history of stroke / transient ischemic attack. Negative history of coronary artery disease.  Negative history of diabetes mellitus.  Positive history of smoking. Not actively smoking - recently quit. Negative history of hypertension. . Negative history of chronic kidney disease.  Negative history of chronic obstructive pulmonary disease.  FUNCTIONAL STATUS: ECOG performance status: (0) Fully active, able to carry on all predisease  performance without restriction Ambulatory status: Ambulatory within the community without limits  Past Medical History:  Diagnosis Date   Anxiety    Arthritis    Cataract    removed with surgery   Diverticulosis    HA (headache)    Hyperlipemia    Sleep apnea    Uses CPAP nightly   Tubular adenoma of colon     Past Surgical History:  Procedure Laterality Date   ADENOIDECTOMY     ANKLE ARTHROSCOPY Left    x2   CATARACT EXTRACTION Bilateral    COLONOSCOPY     polyps   KNEE ARTHROSCOPY Left    x2   MOUTH SURGERY     NASAL SEPTUM SURGERY     right leg surgery     fx fib/tib with steel rod from injury    SALIVARY GLAND SURGERY     TONSILLECTOMY     TYMPANOSTOMY     WISDOM TOOTH EXTRACTION      Family History  Problem Relation Age of Onset   Cancer - Other Sister    Cancer - Other Maternal Grandfather    Heart disease Mother    Diabetes Father    Heart disease Father    Diabetes Maternal Aunt    Diabetes Maternal Grandmother    Colon cancer Neg Hx    Colon polyps Neg Hx    Esophageal cancer Neg Hx    Rectal cancer Neg Hx    Stomach cancer Neg Hx     Social History   Socioeconomic History   Marital status: Married    Spouse name: Not on file   Number of  children: Not on file   Years of education: Not on file   Highest education level: Not on file  Occupational History   Not on file  Tobacco Use   Smoking status: Every Day    Packs/day: 0.25    Years: 40.00    Pack years: 10.00    Types: Cigarettes   Smokeless tobacco: Never   Tobacco comments:    smokes 1 pk /wk  Vaping Use   Vaping Use: Never used  Substance and Sexual Activity   Alcohol use: Yes    Alcohol/week: 6.0 standard drinks    Types: 6 Glasses of wine per week   Drug use: No   Sexual activity: Not on file  Other Topics Concern   Not on file  Social History Narrative   Not on file   Social Determinants of Health   Financial Resource Strain: Not on file  Food Insecurity: Not on  file  Transportation Needs: Not on file  Physical Activity: Not on file  Stress: Not on file  Social Connections: Not on file  Intimate Partner Violence: Not on file    Allergies  Allergen Reactions   Keflex [Cephalexin] Other (See Comments)    abd burning  And pain and diarrhea    Current Outpatient Medications  Medication Sig Dispense Refill   ALPRAZolam (XANAX) 0.25 MG tablet Take 1 tablet by mouth as directed.     LEXAPRO 20 MG tablet Take 20 mg by mouth daily.      Multiple Vitamin (MULTIVITAMIN) tablet Take 1 tablet by mouth daily.     naproxen (NAPROSYN) 500 MG tablet Take 1 tablet by mouth as directed.     Omega-3 Fatty Acids (FISH OIL CONCENTRATE PO) Take 2 capsules by mouth daily.     OVER THE COUNTER MEDICATION Take 2 capsules by mouth daily. Vitamind     rosuvastatin (CRESTOR) 10 MG tablet Take 1 tablet by mouth daily.     sildenafil (VIAGRA) 100 MG tablet Take 1 tablet by mouth as directed.     tadalafil (CIALIS) 5 MG tablet Take 5 mg by mouth daily as needed for erectile dysfunction.     tamsulosin (FLOMAX) 0.4 MG CAPS capsule Take by mouth.     tiZANidine (ZANAFLEX) 2 MG tablet Take 1 tablet by mouth as directed.     traMADol (ULTRAM) 50 MG tablet Take 50 mg by mouth 4 (four) times daily as needed.     No current facility-administered medications for this visit.    REVIEW OF SYSTEMS:  _0  denotes positive finding, _1  denotes negative finding Cardiac  Comments:  Chest pain or chest pressure:    Shortness of breath upon exertion:    Short of breath when lying flat:    Irregular heart rhythm:        Vascular    Pain in calf, thigh, or hip brought on by ambulation:    Pain in feet at night that wakes you up from your sleep:     Blood clot in your veins:    Leg swelling:         Pulmonary    Oxygen at home:    Productive cough:     Wheezing:         Neurologic    Sudden weakness in arms or legs:     Sudden numbness in arms or legs:     Sudden onset of  difficulty speaking or slurred speech:    Temporary loss of vision in  one eye:     Problems with dizziness:         Gastrointestinal    Blood in stool:     Vomited blood:         Genitourinary    Burning when urinating:     Blood in urine:        Psychiatric    Major depression:         Hematologic    Bleeding problems:    Problems with blood clotting too easily:        Skin    Rashes or ulcers:        Constitutional    Fever or chills:      PHYSICAL EXAM Vitals:   03/31/21 1422  BP: (!) 161/91  Pulse: 83  Resp: 20  Temp: 98.5 F (36.9 C)  SpO2: 96%  Weight: 215 lb (97.5 kg)  Height: _0  (1.702 m)    Constitutional: well appearing. no distress. Appears well nourished.  Neurologic: CN intact. no focal findings. no sensory loss. Psychiatric:  Mood and affect symmetric and appropriate. Eyes:  No icterus. No conjunctival pallor. Ears, nose, throat:  mucous membranes moist. Midline trachea.  Cardiac: regular rate and rhythm.  Respiratory:  unlabored. Abdominal:  soft, non-tender, non-distended.  Peripheral vascular: 2+ radial pulses. 2+ DP pulses Extremity: no edema. no cyanosis. no pallor.  Skin: no gangrene. no ulceration.  Lymphatic: no Stemmer's sign. no palpable lymphadenopathy.  PERTINENT LABORATORY AND RADIOLOGIC DATA  Most recent CBC CBC Latest Ref Rng & Units 10/16/2016 05/08/2009 04/30/2009  WBC 4.0 - 10.5 K/uL 9.4 7.6 -  Hemoglobin 13.0 - 17.0 g/dL 14.5 13.4 15.6  Hematocrit 39.0 - 52.0 % 43.1 38.7(L) 46.0  Platelets 150 - 400 K/uL 131(L) 176 -     Most recent CMP CMP Latest Ref Rng & Units 10/16/2016 04/30/2009  Glucose 65 - 99 mg/dL 96 109(H)  BUN 6 - 20 mg/dL 11 9  Creatinine 0.61 - 1.24 mg/dL 0.93 0.9  Sodium 135 - 145 mmol/L 135 134(L)  Potassium 3.5 - 5.1 mmol/L 3.8 3.6  Chloride 101 - 111 mmol/L 101 104  CO2 22 - 32 mmol/L 25 -  Calcium 8.9 - 10.3 mg/dL 9.1 -  Total Protein 6.5 - 8.1 g/dL 7.4 -  Total Bilirubin 0.3 - 1.2 mg/dL 0.9 -   Alkaline Phos 38 - 126 U/L 59 -  AST 15 - 41 U/L 21 -  ALT 17 - 63 U/L 22 -   CLINICAL DATA:  60 year old male with a history of mid abdominal pain. Abnormal x-ray. History of hyperlipidemia. Former smoker.   EXAM: ULTRASOUND OF ABDOMINAL AORTA   TECHNIQUE: Ultrasound examination of the abdominal aorta and proximal common iliac arteries was performed to evaluate for aneurysm. Additional color and Doppler images of the distal aorta were obtained to document patency.   COMPARISON:  Abdomen ultrasound 01/21/2020.   FINDINGS: Abdominal aortic measurements as follows:   Proximal:  2.8 x 2.8 cm (AP by transverse)   Mid:         2.5 x 2.5 cm   Distal: 4.2 x 3.4 cm (images 13 through 16). Aneurysmal configuration, extending about 4.8 cm in length. Patent: Yes, peak systolic velocity is 44   Estimated bilateral iliac artery caliber is 10-12 mm.   IMPRESSION: Positive for a 4.2 cm Abdominal Aortic Aneurysm. Recommend follow-up every 12 months and Vascular consultation.   Reference: J Am Coll Radiol 9211;[id number].     Electronically Signed  By: Genevie Ann M.D.   On: 03/15/2021 10:10  Nyjae Hodge N. Stanford Breed, MD [facility] Phone Number: [phone] 03/31/2021 4:37 PM  Total time spent on preparing this encounter including chart review, data review, collecting history, examining the patient, coordinating care for this new patient, 60 minutes.  Portions of this report may have been transcribed using voice recognition software.  Every effort has been made to ensure accuracy; however, inadvertent computerized transcription errors may still be present.

## 2021-03-31 ENCOUNTER — Encounter: Payer: Self-pay | Admitting: Vascular Surgery

## 2021-03-31 ENCOUNTER — Ambulatory Visit (INDEPENDENT_AMBULATORY_CARE_PROVIDER_SITE_OTHER): Payer: 59 | Admitting: Vascular Surgery

## 2021-03-31 ENCOUNTER — Other Ambulatory Visit: Payer: Self-pay

## 2021-03-31 VITALS — BP 161/91 | HR 83 | Temp 98.5°F | Resp 20 | Ht 67.0 in | Wt 215.0 lb

## 2021-03-31 DIAGNOSIS — I7143 Infrarenal abdominal aortic aneurysm, without rupture: Secondary | ICD-10-CM

## 2021-06-16 DIAGNOSIS — F4321 Adjustment disorder with depressed mood: Secondary | ICD-10-CM | POA: Diagnosis not present

## 2021-06-21 DIAGNOSIS — G4733 Obstructive sleep apnea (adult) (pediatric): Secondary | ICD-10-CM | POA: Diagnosis not present

## 2021-06-27 DIAGNOSIS — M79645 Pain in left finger(s): Secondary | ICD-10-CM | POA: Diagnosis not present

## 2021-06-27 DIAGNOSIS — M65312 Trigger thumb, left thumb: Secondary | ICD-10-CM | POA: Diagnosis not present

## 2021-06-30 DIAGNOSIS — F4321 Adjustment disorder with depressed mood: Secondary | ICD-10-CM | POA: Diagnosis not present

## 2021-07-06 DIAGNOSIS — M2550 Pain in unspecified joint: Secondary | ICD-10-CM | POA: Diagnosis not present

## 2021-07-06 DIAGNOSIS — R030 Elevated blood-pressure reading, without diagnosis of hypertension: Secondary | ICD-10-CM | POA: Diagnosis not present

## 2021-07-06 DIAGNOSIS — F419 Anxiety disorder, unspecified: Secondary | ICD-10-CM | POA: Diagnosis not present

## 2021-07-06 DIAGNOSIS — F4310 Post-traumatic stress disorder, unspecified: Secondary | ICD-10-CM | POA: Diagnosis not present

## 2021-07-14 DIAGNOSIS — F4321 Adjustment disorder with depressed mood: Secondary | ICD-10-CM | POA: Diagnosis not present

## 2021-07-17 DIAGNOSIS — G4733 Obstructive sleep apnea (adult) (pediatric): Secondary | ICD-10-CM | POA: Diagnosis not present

## 2021-07-28 DIAGNOSIS — F4321 Adjustment disorder with depressed mood: Secondary | ICD-10-CM | POA: Diagnosis not present

## 2021-08-10 DIAGNOSIS — I10 Essential (primary) hypertension: Secondary | ICD-10-CM | POA: Diagnosis not present

## 2021-08-11 DIAGNOSIS — F4321 Adjustment disorder with depressed mood: Secondary | ICD-10-CM | POA: Diagnosis not present

## 2021-08-29 DIAGNOSIS — H04123 Dry eye syndrome of bilateral lacrimal glands: Secondary | ICD-10-CM | POA: Diagnosis not present

## 2021-08-29 DIAGNOSIS — H10413 Chronic giant papillary conjunctivitis, bilateral: Secondary | ICD-10-CM | POA: Diagnosis not present

## 2021-08-29 DIAGNOSIS — H02831 Dermatochalasis of right upper eyelid: Secondary | ICD-10-CM | POA: Diagnosis not present

## 2021-08-29 DIAGNOSIS — H2000 Unspecified acute and subacute iridocyclitis: Secondary | ICD-10-CM | POA: Diagnosis not present

## 2021-09-01 DIAGNOSIS — F4321 Adjustment disorder with depressed mood: Secondary | ICD-10-CM | POA: Diagnosis not present

## 2021-09-08 DIAGNOSIS — H5713 Ocular pain, bilateral: Secondary | ICD-10-CM | POA: Diagnosis not present

## 2021-09-13 DIAGNOSIS — J069 Acute upper respiratory infection, unspecified: Secondary | ICD-10-CM | POA: Diagnosis not present

## 2021-10-05 DIAGNOSIS — U071 COVID-19: Secondary | ICD-10-CM | POA: Diagnosis not present

## 2021-10-20 DIAGNOSIS — F4321 Adjustment disorder with depressed mood: Secondary | ICD-10-CM | POA: Diagnosis not present

## 2021-11-08 DIAGNOSIS — G4733 Obstructive sleep apnea (adult) (pediatric): Secondary | ICD-10-CM | POA: Diagnosis not present

## 2021-11-09 DIAGNOSIS — M25561 Pain in right knee: Secondary | ICD-10-CM | POA: Diagnosis not present

## 2021-11-09 DIAGNOSIS — M1712 Unilateral primary osteoarthritis, left knee: Secondary | ICD-10-CM | POA: Diagnosis not present

## 2021-11-10 DIAGNOSIS — F4321 Adjustment disorder with depressed mood: Secondary | ICD-10-CM | POA: Diagnosis not present

## 2021-11-16 ENCOUNTER — Ambulatory Visit: Payer: BC Managed Care – PPO | Admitting: Podiatry

## 2021-11-16 DIAGNOSIS — L600 Ingrowing nail: Secondary | ICD-10-CM

## 2021-11-16 MED ORDER — NEOMYCIN-POLYMYXIN-HC 1 % OT SOLN: OTIC | 0 refills | Status: DC

## 2021-11-16 NOTE — Patient Instructions (Signed)

## 2021-11-20 NOTE — Progress Notes (Signed)
  Subjective:  Patient ID: [name], male    DOB: [birth year],  MRN: [id number]  Chief Complaint  Patient presents with    toe nail pain    Left & right foot great toe nail pain.    61 y.o. male presents with the above complaint. History confirmed with patient.  Ingrown nails are painful and recurrent  Objective:  Physical Exam: warm, good capillary refill, no trophic changes or ulcerative lesions, normal DP and PT pulses, normal sensory exam, and bilateral ingrowing hallux nails Assessment:   1. Ingrowing left great toenail   2. Ingrowing right great toenail      Plan:  Patient was evaluated and treated and all questions answered.    Ingrown Nail, bilaterally -Patient elects to proceed with minor surgery to remove ingrown toenail today. Consent reviewed and signed by patient. -Ingrown nail excised. See procedure note. -Educated on post-procedure care including soaking. Written instructions provided and reviewed. -Patient to follow up in 2 weeks for nail check.  Procedure: Excision of Ingrown Toenail Location: Bilateral 1st toe medial nail borders. Anesthesia: Lidocaine 1% plain; 1.5 mL and Marcaine 0.5% plain; 1.5 mL, digital block. Skin Prep: Betadine. Dressing: Silvadene; telfa; dry, sterile, compression dressing. Technique: Following skin prep, the toe was exsanguinated and a tourniquet was secured at the base of the toe. The affected nail border was freed, split with a nail splitter, and excised. Chemical matrixectomy was then performed with phenol and irrigated out with alcohol. The tourniquet was then removed and sterile dressing applied. Disposition: Patient tolerated procedure well. Patient to return in 2 weeks for follow-up.    Return in about 3 weeks (around 12/07/2021) for nail re-check.

## 2021-11-29 ENCOUNTER — Ambulatory Visit (INDEPENDENT_AMBULATORY_CARE_PROVIDER_SITE_OTHER): Payer: BC Managed Care – PPO | Admitting: Podiatry

## 2021-11-29 DIAGNOSIS — L600 Ingrowing nail: Secondary | ICD-10-CM

## 2021-11-29 DIAGNOSIS — L03031 Cellulitis of right toe: Secondary | ICD-10-CM

## 2021-11-29 MED ORDER — DOXYCYCLINE HYCLATE 100 MG PO TABS: 100.0000 mg | ORAL_TABLET | Freq: Two times a day (BID) | ORAL | 0 refills | Status: DC

## 2021-11-30 ENCOUNTER — Other Ambulatory Visit: Payer: BC Managed Care – PPO

## 2021-12-07 DIAGNOSIS — H524 Presbyopia: Secondary | ICD-10-CM | POA: Diagnosis not present

## 2021-12-07 DIAGNOSIS — Z961 Presence of intraocular lens: Secondary | ICD-10-CM | POA: Diagnosis not present

## 2021-12-07 DIAGNOSIS — H52203 Unspecified astigmatism, bilateral: Secondary | ICD-10-CM | POA: Diagnosis not present

## 2021-12-08 DIAGNOSIS — F4321 Adjustment disorder with depressed mood: Secondary | ICD-10-CM | POA: Diagnosis not present

## 2021-12-29 DIAGNOSIS — F4321 Adjustment disorder with depressed mood: Secondary | ICD-10-CM | POA: Diagnosis not present

## 2022-01-08 DIAGNOSIS — Z13220 Encounter for screening for lipoid disorders: Secondary | ICD-10-CM | POA: Diagnosis not present

## 2022-01-08 DIAGNOSIS — Z0000 Encounter for general adult medical examination without abnormal findings: Secondary | ICD-10-CM | POA: Diagnosis not present

## 2022-01-08 DIAGNOSIS — E559 Vitamin D deficiency, unspecified: Secondary | ICD-10-CM | POA: Diagnosis not present

## 2022-01-11 DIAGNOSIS — F411 Generalized anxiety disorder: Secondary | ICD-10-CM | POA: Diagnosis not present

## 2022-01-11 DIAGNOSIS — Z0000 Encounter for general adult medical examination without abnormal findings: Secondary | ICD-10-CM | POA: Diagnosis not present

## 2022-01-11 DIAGNOSIS — F4310 Post-traumatic stress disorder, unspecified: Secondary | ICD-10-CM | POA: Diagnosis not present

## 2022-01-11 DIAGNOSIS — K429 Umbilical hernia without obstruction or gangrene: Secondary | ICD-10-CM | POA: Diagnosis not present

## 2022-01-11 DIAGNOSIS — E559 Vitamin D deficiency, unspecified: Secondary | ICD-10-CM | POA: Diagnosis not present

## 2022-01-12 ENCOUNTER — Ambulatory Visit (INDEPENDENT_AMBULATORY_CARE_PROVIDER_SITE_OTHER): Payer: BC Managed Care – PPO | Admitting: Podiatry

## 2022-01-12 ENCOUNTER — Encounter: Payer: Self-pay | Admitting: Podiatry

## 2022-01-12 DIAGNOSIS — L600 Ingrowing nail: Secondary | ICD-10-CM

## 2022-01-12 DIAGNOSIS — L03031 Cellulitis of right toe: Secondary | ICD-10-CM | POA: Diagnosis not present

## 2022-01-12 DIAGNOSIS — F4321 Adjustment disorder with depressed mood: Secondary | ICD-10-CM | POA: Diagnosis not present

## 2022-01-12 NOTE — Progress Notes (Signed)
Subjective:   Patient ID: [name], male   DOB: 61 y.o.   MRN: [id number]   HPI Patient presents stating that he had an ingrown toenail taken care of approximately 8 weeks ago and its gotten crusted in the corner and is making it hard to wear shoe gear comfortably.  Has not noted any current drainage but there is been some slight redness and has been on an antibiotic   ROS      Objective:  Physical Exam  Neurovascular status intact with crusted tissue in the medial side of the right hallux with slight redness noted and discomfort when pressed.  The nailbed itself is relatively unhealthy mildly tender but not like the corner is     Assessment:  Possibility for paronychia of the right hallux medial border with crusted tissue formation     Plan:  Educated him on this I do think it is more localized I do not see any proximal signs of edema erythema drainage and I went ahead today and I anesthetized the right hallux 60 mg like Marcaine mixture with sterile instrumentation I remove the medial border removed proud flesh I did not note any active drainage I flushed it and applied sterile dressing.  This should solve but if issues were to occur he is to return

## 2022-01-12 NOTE — Patient Instructions (Signed)

## 2022-01-19 DIAGNOSIS — F4321 Adjustment disorder with depressed mood: Secondary | ICD-10-CM | POA: Diagnosis not present

## 2022-01-23 ENCOUNTER — Ambulatory Visit: Payer: BC Managed Care – PPO | Admitting: Podiatry

## 2022-01-24 ENCOUNTER — Ambulatory Visit: Payer: BC Managed Care – PPO | Admitting: Podiatry

## 2022-02-01 DIAGNOSIS — K122 Cellulitis and abscess of mouth: Secondary | ICD-10-CM | POA: Diagnosis not present

## 2022-02-02 DIAGNOSIS — F4321 Adjustment disorder with depressed mood: Secondary | ICD-10-CM | POA: Diagnosis not present

## 2022-02-05 DIAGNOSIS — M47896 Other spondylosis, lumbar region: Secondary | ICD-10-CM | POA: Diagnosis not present

## 2022-02-22 ENCOUNTER — Ambulatory Visit (HOSPITAL_BASED_OUTPATIENT_CLINIC_OR_DEPARTMENT_OTHER): Payer: Self-pay | Admitting: Cardiology

## 2022-02-22 DIAGNOSIS — F4321 Adjustment disorder with depressed mood: Secondary | ICD-10-CM | POA: Diagnosis not present

## 2022-03-08 DIAGNOSIS — F4321 Adjustment disorder with depressed mood: Secondary | ICD-10-CM | POA: Diagnosis not present

## 2022-03-23 DIAGNOSIS — F4321 Adjustment disorder with depressed mood: Secondary | ICD-10-CM | POA: Diagnosis not present

## 2022-04-05 DIAGNOSIS — F4321 Adjustment disorder with depressed mood: Secondary | ICD-10-CM | POA: Diagnosis not present

## 2022-04-18 DIAGNOSIS — H15101 Unspecified episcleritis, right eye: Secondary | ICD-10-CM | POA: Diagnosis not present

## 2022-04-18 DIAGNOSIS — M1712 Unilateral primary osteoarthritis, left knee: Secondary | ICD-10-CM | POA: Diagnosis not present

## 2022-04-20 DIAGNOSIS — F4321 Adjustment disorder with depressed mood: Secondary | ICD-10-CM | POA: Diagnosis not present

## 2022-04-25 DIAGNOSIS — M7712 Lateral epicondylitis, left elbow: Secondary | ICD-10-CM | POA: Diagnosis not present

## 2022-04-25 DIAGNOSIS — H01004 Unspecified blepharitis left upper eyelid: Secondary | ICD-10-CM | POA: Diagnosis not present

## 2022-04-25 DIAGNOSIS — M1712 Unilateral primary osteoarthritis, left knee: Secondary | ICD-10-CM | POA: Diagnosis not present

## 2022-04-25 DIAGNOSIS — H01001 Unspecified blepharitis right upper eyelid: Secondary | ICD-10-CM | POA: Diagnosis not present

## 2022-05-01 DIAGNOSIS — J029 Acute pharyngitis, unspecified: Secondary | ICD-10-CM | POA: Diagnosis not present

## 2022-05-02 DIAGNOSIS — M1712 Unilateral primary osteoarthritis, left knee: Secondary | ICD-10-CM | POA: Diagnosis not present

## 2022-05-18 DIAGNOSIS — F4321 Adjustment disorder with depressed mood: Secondary | ICD-10-CM | POA: Diagnosis not present

## 2022-05-24 DIAGNOSIS — F4321 Adjustment disorder with depressed mood: Secondary | ICD-10-CM | POA: Diagnosis not present

## 2022-06-07 DIAGNOSIS — R972 Elevated prostate specific antigen [PSA]: Secondary | ICD-10-CM | POA: Diagnosis not present

## 2022-06-07 DIAGNOSIS — R350 Frequency of micturition: Secondary | ICD-10-CM | POA: Diagnosis not present

## 2022-06-15 ENCOUNTER — Encounter: Payer: Self-pay | Admitting: Podiatry

## 2022-06-15 ENCOUNTER — Ambulatory Visit: Payer: BC Managed Care – PPO | Admitting: Podiatry

## 2022-06-15 DIAGNOSIS — L600 Ingrowing nail: Secondary | ICD-10-CM

## 2022-06-15 DIAGNOSIS — I999 Unspecified disorder of circulatory system: Secondary | ICD-10-CM

## 2022-06-15 DIAGNOSIS — F411 Generalized anxiety disorder: Secondary | ICD-10-CM | POA: Diagnosis not present

## 2022-06-15 NOTE — Patient Instructions (Signed)

## 2022-06-15 NOTE — Progress Notes (Signed)
Subjective:   Patient ID: [name], male   DOB: 62 y.o.   MRN: [id number]   HPI Patient presents stating the left big toenail has really been bothering him and the right one is somewhat flaky but it does not hurt.   ROS      Objective:  Physical Exam  Neurovascular status right found to be intact I did note there to be absent pulses on the left in comparison to the right.  I did question he has a lot of knee problems and is not active so is difficult to make a complete determine on claudication but he does have discomfort mostly across the entire nailbed left big toenail history of ingrown toenails which have done well but I am concerned about the amount of pain he is having the fact he smokes a pack of cigarettes per day     Assessment:  Cannot rule out possibility for vascular disease being part of the pathology and pain that this patient is experiencing     Plan:  H&P reviewed and at this point before I would consider removal of the nail and get a go ahead and get a vascular study left to rule out subtle vascular disease.  We will get those results and then make a determination of what will be best long-term with all education given to patient today and test for ABI ordered

## 2022-06-18 ENCOUNTER — Ambulatory Visit (HOSPITAL_COMMUNITY)
Admission: RE | Admit: 2022-06-18 | Discharge: 2022-06-18 | Disposition: A | Discharge status: Home / Self Care | Payer: BC Managed Care – PPO | Source: Ambulatory Visit | Attending: Podiatry | Admitting: Podiatry

## 2022-06-18 DIAGNOSIS — I999 Unspecified disorder of circulatory system: Secondary | ICD-10-CM

## 2022-06-18 LAB — VAS US ABI WITH/WO TBI
Left ABI: 0.64
Right ABI: 0.83

## 2022-06-18 NOTE — Progress Notes (Signed)
I would think this patient should see vascular for evaluation and further studies of left lower leg

## 2022-06-19 DIAGNOSIS — R972 Elevated prostate specific antigen [PSA]: Secondary | ICD-10-CM | POA: Diagnosis not present

## 2022-06-19 DIAGNOSIS — R350 Frequency of micturition: Secondary | ICD-10-CM | POA: Diagnosis not present

## 2022-06-19 DIAGNOSIS — N5201 Erectile dysfunction due to arterial insufficiency: Secondary | ICD-10-CM | POA: Diagnosis not present

## 2022-06-20 ENCOUNTER — Other Ambulatory Visit: Payer: Self-pay

## 2022-06-20 DIAGNOSIS — I999 Unspecified disorder of circulatory system: Secondary | ICD-10-CM

## 2022-06-27 ENCOUNTER — Telehealth: Payer: Self-pay | Admitting: *Deleted

## 2022-06-27 NOTE — Telephone Encounter (Signed)
Patient is calling for the results of his US vascular study that has been completed 06/18/22, please advise , would like to schedule appointment to have his toe evaluated as well.

## 2022-06-27 NOTE — Telephone Encounter (Signed)
Pt left message on voicemail stating he needed an appt to go over test results  I returned call and pt is scheduled to see Dr Paulla Dolly 1.18.2024 and he did ask if he would have a copay and I told him probably yes because this is another office visit to go over results from a test that was ordered. He stated that what he came in for last time did not get taken care of and I did explain that if the provider bills and office visit the insurance will say pt has to pay a copay for office visit.

## 2022-06-28 ENCOUNTER — Encounter: Payer: Self-pay | Admitting: Podiatry

## 2022-06-28 ENCOUNTER — Ambulatory Visit (INDEPENDENT_AMBULATORY_CARE_PROVIDER_SITE_OTHER): Payer: BC Managed Care – PPO | Admitting: Podiatry

## 2022-06-28 DIAGNOSIS — L600 Ingrowing nail: Secondary | ICD-10-CM | POA: Diagnosis not present

## 2022-06-28 DIAGNOSIS — I999 Unspecified disorder of circulatory system: Secondary | ICD-10-CM | POA: Diagnosis not present

## 2022-06-28 NOTE — Progress Notes (Signed)
Subjective:   Patient ID: [name], male   DOB: 62 y.o.   MRN: [id number]   HPI Patient presents stating my left big toe is still really bothering me and I did have my circulatory's test done and I like to review   ROS      Objective:  Physical Exam  Vascular status remains significantly diminished on the left side with patient having pain in the big toe which is both on weightbearing and nonweightbearing with elevation and patient does continue to smoke at the current time.  There is no redness there is no drainage noted currently but it is tender around the nailbed     Assessment:  I do think circulatory issues are a part of this patient's problem with the diminishment of ABI on the right side of 0.63 and the digit of 0.44 with possibility of some nail irritation that is also part of this issue     Plan:  Again discussed at great length stopping smoking and he promises that he is good to get Chantix from his physician.  I do think he should have a vascular consult and be looked at aggressively given the symptoms he is experiencing and I did explain the possibility for angiogram or possible other treatments depending on the vascular evaluation and we are referring him for a vascular consult.  Today I anesthetized the left big toe I carefully using sterile instrumentation just debrided out some nail border on both the medial lateral sides to try to reduce some of the stress on the nail and applied sterile dressings.  Patient will be seen back as needed understands that he is at higher risk for ultimate amputation

## 2022-06-29 DIAGNOSIS — F4321 Adjustment disorder with depressed mood: Secondary | ICD-10-CM | POA: Diagnosis not present

## 2022-07-11 DIAGNOSIS — G4733 Obstructive sleep apnea (adult) (pediatric): Secondary | ICD-10-CM | POA: Diagnosis not present

## 2022-07-12 DIAGNOSIS — N342 Other urethritis: Secondary | ICD-10-CM | POA: Diagnosis not present

## 2022-07-12 DIAGNOSIS — R300 Dysuria: Secondary | ICD-10-CM | POA: Diagnosis not present

## 2022-07-12 DIAGNOSIS — G479 Sleep disorder, unspecified: Secondary | ICD-10-CM | POA: Diagnosis not present

## 2022-07-13 DIAGNOSIS — F4321 Adjustment disorder with depressed mood: Secondary | ICD-10-CM | POA: Diagnosis not present

## 2022-07-16 ENCOUNTER — Other Ambulatory Visit: Payer: Self-pay | Admitting: *Deleted

## 2022-07-16 DIAGNOSIS — I7143 Infrarenal abdominal aortic aneurysm, without rupture: Secondary | ICD-10-CM

## 2022-07-20 DIAGNOSIS — R8271 Bacteriuria: Secondary | ICD-10-CM | POA: Diagnosis not present

## 2022-07-20 DIAGNOSIS — R3916 Straining to void: Secondary | ICD-10-CM | POA: Diagnosis not present

## 2022-07-20 DIAGNOSIS — N401 Enlarged prostate with lower urinary tract symptoms: Secondary | ICD-10-CM | POA: Diagnosis not present

## 2022-07-20 DIAGNOSIS — R300 Dysuria: Secondary | ICD-10-CM | POA: Diagnosis not present

## 2022-07-26 DIAGNOSIS — F411 Generalized anxiety disorder: Secondary | ICD-10-CM | POA: Diagnosis not present

## 2022-07-26 DIAGNOSIS — R0981 Nasal congestion: Secondary | ICD-10-CM | POA: Diagnosis not present

## 2022-07-26 DIAGNOSIS — Z720 Tobacco use: Secondary | ICD-10-CM | POA: Diagnosis not present

## 2022-07-27 DIAGNOSIS — F4321 Adjustment disorder with depressed mood: Secondary | ICD-10-CM | POA: Diagnosis not present

## 2022-07-30 NOTE — Progress Notes (Unsigned)
[facility]   ASSESSMENT / PLAN: [name] is a 62 y.o. male with an infrarenal abdominal aortic aneurysm measuring 13m. 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  Recommend the following to reduce the risk of major adverse cardiac / limb events.  Cessation from all tobacco products. Blood glucose control with goal A1c < 7%. Blood pressure control with goal blood pressure < 140/90 mmHg. Lipid reduction therapy with goal LDL-C <100 mg/dL. Aspirin 832mPO QD.  Atorvastatin 40-80mg PO QD (or other "high intensity" statin therapy).  The patient's aneurysm continues to grow slowly.  He now has intermittent claudication of bilateral lower extremities/I counseled him about the benign nature of this finding.  I encouraged him to work on walking.  He is working hard to quit smoking and I encouraged him in this endeavor.  I will see him again in several months with repeat noninvasive testing to monitor his claudication symptoms and aneurysm status.  CHIEF COMPLAINT: AAA  HISTORY OF PRESENT ILLNESS: [name] a [age].o. male referred to clinic for evaluation of abdominal aortic aneurysm.  This was demonstrated incidentally during work-up for cause of back pain.  He has no symptoms referable to his aneurysm.  We spent the bulk of the visit discussing the natural history of aortic aneurysm disease, the rationale for intervention at 5-1/2 cm, and the options available for intervention when the size threshold is met.  07/31/22: patient returns to clinic for [place] is working hard on quitting smoking. He has developed claudication in bilateral lower extremities. He reports a symptomatic umbilical hernia.   VASCULAR SURGICAL HISTORY: none  VASCULAR RISK FACTORS: Negative history of stroke / transient ischemic attack. Negative history of coronary artery disease.  Negative history of diabetes  mellitus.  Positive history of smoking. Not actively smoking - recently quit. Negative history of hypertension. . Negative history of chronic kidney disease.  Negative history of chronic obstructive pulmonary disease.  FUNCTIONAL STATUS: ECOG performance status: (0) Fully active, able to carry on all predisease performance without restriction Ambulatory status: Ambulatory within the community with limits  Past Medical History:  Diagnosis Date   Anxiety    Arthritis    Cataract    removed with surgery   Diverticulosis    HA (headache)    Hyperlipemia    Sleep apnea    Uses CPAP nightly   Tubular adenoma of colon     Past Surgical History:  Procedure Laterality Date   ADENOIDECTOMY     ANKLE ARTHROSCOPY Left    x2   CATARACT EXTRACTION Bilateral    COLONOSCOPY     polyps   KNEE ARTHROSCOPY Left    x2   MOUTH SURGERY     NASAL SEPTUM SURGERY     right leg surgery     fx fib/tib with steel rod from injury    SALIVARY GLAND SURGERY     TONSILLECTOMY     TYMPANOSTOMY     WISDOM TOOTH EXTRACTION      Family History  Problem Relation Age of Onset   Cancer - Other Sister    Cancer - Other Maternal Grandfather    Heart disease Mother    Diabetes Father    Heart disease Father    Diabetes Maternal Aunt    Diabetes Maternal Grandmother    Colon cancer Neg Hx    Colon polyps Neg Hx    Esophageal cancer Neg Hx  Rectal cancer Neg Hx    Stomach cancer Neg Hx     Social History   Socioeconomic History   Marital status: Married    Spouse name: Not on file   Number of children: Not on file   Years of education: Not on file   Highest education level: Not on file  Occupational History   Not on file  Tobacco Use   Smoking status: Every Day    Packs/day: 0.50    Years: 40.00    Total pack years: 20.00    Types: Cigarettes   Smokeless tobacco: Never   Tobacco comments:    smokes 1 pk /wk  Vaping Use   Vaping Use: Never used  Substance and Sexual Activity    Alcohol use: Yes    Alcohol/week: 6.0 standard drinks of alcohol    Types: 6 Glasses of wine per week   Drug use: No   Sexual activity: Not on file  Other Topics Concern   Not on file  Social History Narrative   Not on file   Social Determinants of Health   Financial Resource Strain: Not on file  Food Insecurity: Not on file  Transportation Needs: Not on file  Physical Activity: Not on file  Stress: Not on file  Social Connections: Not on file  Intimate Partner Violence: Not on file    Allergies  Allergen Reactions   Keflex [Cephalexin] Other (See Comments)    abd burning  And pain and diarrhea    Current Outpatient Medications  Medication Sig Dispense Refill   ALPRAZolam (XANAX) 0.25 MG tablet Take 1 tablet by mouth as directed.     LEXAPRO 20 MG tablet Take 20 mg by mouth daily.      Multiple Vitamin (MULTIVITAMIN) tablet Take 1 tablet by mouth daily.     OVER THE COUNTER MEDICATION Take 2 capsules by mouth daily. Vitamind     rosuvastatin (CRESTOR) 10 MG tablet Take 1 tablet by mouth daily.     sildenafil (VIAGRA) 100 MG tablet Take 1 tablet by mouth as directed.     tadalafil (CIALIS) 5 MG tablet Take 5 mg by mouth daily as needed for erectile dysfunction.     tamsulosin (FLOMAX) 0.4 MG CAPS capsule Take by mouth.     tiZANidine (ZANAFLEX) 2 MG tablet Take 1 tablet by mouth as directed.     traMADol (ULTRAM) 50 MG tablet Take 50 mg by mouth 4 (four) times daily as needed.     No current facility-administered medications for this visit.    PHYSICAL EXAM Vitals:   07/31/22 0912  BP: 136/78  Pulse: 85  Temp: 98.9 F (37.2 C)  SpO2: 98%  Weight: 211 lb 11.2 oz (96 kg)  Height: 5' 7"$  (1.702 m)    Well appearing No distress Regular rate and rhythm Unlabored breathing No pedal pulses palpable. Feet are warm.  PERTINENT LABORATORY AND RADIOLOGIC DATA  Most recent CBC    Latest Ref Rng & Units 10/16/2016    3:59 PM 05/08/2009    2:00 PM 04/30/2009    8:38  PM  CBC  WBC 4.0 - 10.5 K/uL 9.4  7.6    Hemoglobin 13.0 - 17.0 g/dL 14.5  13.4  15.6   Hematocrit 39.0 - 52.0 % 43.1  38.7  46.0   Platelets 150 - 400 K/uL 131  176       Most recent CMP    Latest Ref Rng & Units 10/16/2016    3:59  PM 04/30/2009    8:38 PM  CMP  Glucose 65 - 99 mg/dL 96  109   BUN 6 - 20 mg/dL 11  9   Creatinine 0.61 - 1.24 mg/dL 0.93  0.9   Sodium 135 - 145 mmol/L 135  134   Potassium 3.5 - 5.1 mmol/L 3.8  3.6   Chloride 101 - 111 mmol/L 101  104   CO2 22 - 32 mmol/L 25    Calcium 8.9 - 10.3 mg/dL 9.1    Total Protein 6.5 - 8.1 g/dL 7.4    Total Bilirubin 0.3 - 1.2 mg/dL 0.9    Alkaline Phos 38 - 126 U/L 59    AST 15 - 41 U/L 21    ALT 17 - 63 U/L 22      +-------+-----------+-----------+------------+------------+  ABI/TBIToday's ABIToday's TBIPrevious ABIPrevious TBI  +-------+-----------+-----------+------------+------------+  Right 0.83       0.54                                 +-------+-----------+-----------+------------+------------+  Left  0.64       0.44                                 +-------+-----------+-----------+------------+------------+   Abdominal Aorta: There is evidence of abnormal dilatation of the distal  Abdominal aorta. The largest aortic diameter has increased compared to  prior exam. Previous diameter measurement was 4.2 cm obtained on 03/15/21.   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 Stanford Breed, MD FACS [facility] Phone Number: [phone] 07/31/2022 2:09 PM  Total time spent on preparing this encounter including chart review, data review, collecting history, examining the patient, coordinating care for this established patient, 40 minutes.  Portions of  this report may have been transcribed using voice recognition software.  Every effort has been made to ensure accuracy; however, inadvertent computerized transcription errors may still be present.

## 2022-07-31 ENCOUNTER — Ambulatory Visit (INDEPENDENT_AMBULATORY_CARE_PROVIDER_SITE_OTHER): Payer: BC Managed Care – PPO | Admitting: Vascular Surgery

## 2022-07-31 ENCOUNTER — Encounter: Payer: Self-pay | Admitting: Vascular Surgery

## 2022-07-31 ENCOUNTER — Ambulatory Visit (HOSPITAL_COMMUNITY)
Admission: RE | Admit: 2022-07-31 | Discharge: 2022-07-31 | Disposition: A | Discharge status: Home / Self Care | Payer: BC Managed Care – PPO | Source: Ambulatory Visit | Attending: Vascular Surgery | Admitting: Vascular Surgery

## 2022-07-31 VITALS — BP 136/78 | HR 85 | Temp 98.9°F | Ht 67.0 in | Wt 211.7 lb

## 2022-07-31 DIAGNOSIS — I7143 Infrarenal abdominal aortic aneurysm, without rupture: Secondary | ICD-10-CM

## 2022-07-31 DIAGNOSIS — I70219 Atherosclerosis of native arteries of extremities with intermittent claudication, unspecified extremity: Secondary | ICD-10-CM | POA: Diagnosis not present

## 2022-08-01 ENCOUNTER — Ambulatory Visit (HOSPITAL_BASED_OUTPATIENT_CLINIC_OR_DEPARTMENT_OTHER): Payer: BC Managed Care – PPO | Admitting: Cardiology

## 2022-08-04 ENCOUNTER — Other Ambulatory Visit: Payer: Self-pay

## 2022-08-04 DIAGNOSIS — I7143 Infrarenal abdominal aortic aneurysm, without rupture: Secondary | ICD-10-CM

## 2022-08-04 DIAGNOSIS — I70219 Atherosclerosis of native arteries of extremities with intermittent claudication, unspecified extremity: Secondary | ICD-10-CM

## 2022-08-08 ENCOUNTER — Encounter (HOSPITAL_BASED_OUTPATIENT_CLINIC_OR_DEPARTMENT_OTHER): Payer: Self-pay | Admitting: Cardiology

## 2022-08-08 ENCOUNTER — Ambulatory Visit (HOSPITAL_BASED_OUTPATIENT_CLINIC_OR_DEPARTMENT_OTHER): Payer: BC Managed Care – PPO | Admitting: Cardiology

## 2022-08-08 VITALS — BP 112/64 | HR 76 | Ht 67.0 in | Wt 213.6 lb

## 2022-08-08 DIAGNOSIS — E6609 Other obesity due to excess calories: Secondary | ICD-10-CM

## 2022-08-08 DIAGNOSIS — E782 Mixed hyperlipidemia: Secondary | ICD-10-CM | POA: Diagnosis not present

## 2022-08-08 DIAGNOSIS — Z6833 Body mass index (BMI) 33.0-33.9, adult: Secondary | ICD-10-CM

## 2022-08-08 DIAGNOSIS — G4733 Obstructive sleep apnea (adult) (pediatric): Secondary | ICD-10-CM

## 2022-08-08 DIAGNOSIS — I999 Unspecified disorder of circulatory system: Secondary | ICD-10-CM

## 2022-08-08 DIAGNOSIS — I10 Essential (primary) hypertension: Secondary | ICD-10-CM

## 2022-08-08 DIAGNOSIS — Z716 Tobacco abuse counseling: Secondary | ICD-10-CM | POA: Diagnosis not present

## 2022-08-08 DIAGNOSIS — I7143 Infrarenal abdominal aortic aneurysm, without rupture: Secondary | ICD-10-CM

## 2022-08-08 NOTE — Progress Notes (Signed)
Cardiology Office Note:    Date:  08/08/2022   ID:  [name], DOB 1961/04/08, MRN ID:[id number]  PCP:  Lawerance Cruel, MD  Cardiologist:  Buford Dresser, MD PhD  Referring MD: Lawerance Cruel, MD   CC: Follow-up  History of Present Illness:    [name] is a 62 y.o. male with a hx of PAD, AAA, OSA on CPAP, mixed hyperlipidemia, obesity who is seen for follow-up. I initially met him 02/18/2019 as a new consult at the request of Lawerance Cruel, MD for the evaluation and management of family history of cardiovascular disease.  Cardiovascular risk factors: Comorbid conditions: hyperlipidemia. Thinks his highest cholesterol was 247 (Tchol). First told in his late 85s that he has high cholesterol.  Started rosuvastatin about 6 weeks ago, started on 20 mg but felt weird. Doing well on 10 mg dose. Was on fish oil before. Current smoker, working on cessation Family history: Three sisters; one sister with high cholesterol. One sister with nasal cancer, other sister with Lewy Body dementia. Mother had high cholesterol, died age 58 (assumed MI); has stroke when she was 57. Father died age 87 of MI, had a stroke age 32-60. Only remembers one grandparent, unsure of all their history. Uncle might have had MI in 45s, not sure. No definitive early CAD, no SCD.   Labs from Dr Harrington Challenger 11/13/18 (prior to statin initiation) Tchol 248, HDL 37, TG 326, LDL 146  Today, he states that he is feeling okay. He confirms that he does have pain in his legs while walking. He was seen by vascular surgery recently. Also he believes some of his leg pain is attributable to his left hip, and the fact that he needs a left knee replacement. Due to muscle spasms he is unable to keep walking. Recent strenuous activities include cleaning up in the yard.  Lately he is also feeling very fatigued. He endorses sleep apnea and uses his CPAP religiously.  In clinic today his blood pressure is 112/64 which he  states is low for him. At home his readings are closer to 130s/80s. He has been on valsartan for less than a year.  Here or there he experiences "a little flutter." In the mornings, his left 3rd to 5th fingers are numb.   Additionally he complains of an umbilical hernia. He notes that he will not able to have surgery unless he quits smoking. Recently he was given nicotine patches and gum.  He has struggled with severe myalgias on rosuvastatin. Previously this was decreased to 3 times a week. He was advised to hold the statin during a trip to [place], and for a time he never resumed it. Recently he restarted rosuvastatin and now takes it twice a week. Generally he eats well with a lot of vegetables. He avoids fried foods.  He denies any chest pain, shortness of breath, lightheadedness, headaches, syncope, orthopnea, or PND.   Past Medical History:  Diagnosis Date   Anxiety    Arthritis    Cataract    removed with surgery   Diverticulosis    HA (headache)    Hyperlipemia    Sleep apnea    Uses CPAP nightly   Tubular adenoma of colon     Past Surgical History:  Procedure Laterality Date   ADENOIDECTOMY     ANKLE ARTHROSCOPY Left    x2   CATARACT EXTRACTION Bilateral    COLONOSCOPY     polyps   KNEE ARTHROSCOPY Left  x2   MOUTH SURGERY     NASAL SEPTUM SURGERY     right leg surgery     fx fib/tib with steel rod from injury    SALIVARY GLAND SURGERY     TONSILLECTOMY     TYMPANOSTOMY     WISDOM TOOTH EXTRACTION      Current Medications: Current Outpatient Medications on File Prior to Visit  Medication Sig   ALPRAZolam (XANAX) 0.25 MG tablet Take 1 tablet by mouth as directed.   LEXAPRO 20 MG tablet Take 30 mg by mouth daily.   Multiple Vitamin (MULTIVITAMIN) tablet Take 1 tablet by mouth daily.   OVER THE COUNTER MEDICATION Take 2 capsules by mouth daily. Vitamind   rosuvastatin (CRESTOR) 10 MG tablet Take 1 tablet by mouth daily.   silodosin (RAPAFLO) 8 MG CAPS  capsule Take 8 mg by mouth at bedtime.   tiZANidine (ZANAFLEX) 2 MG tablet Take 1 tablet by mouth as directed.   traMADol (ULTRAM) 50 MG tablet Take 50 mg by mouth 4 (four) times daily as needed.   valsartan (DIOVAN) 160 MG tablet Take 160 mg by mouth daily.   sildenafil (VIAGRA) 100 MG tablet Take 1 tablet by mouth as directed.   tadalafil (CIALIS) 5 MG tablet Take 5 mg by mouth daily as needed for erectile dysfunction.   No current facility-administered medications on file prior to visit.     Allergies:   Keflex [cephalexin]   Social History   Tobacco Use   Smoking status: Every Day    Packs/day: 0.50    Years: 40.00    Total pack years: 20.00    Types: Cigarettes   Smokeless tobacco: Never   Tobacco comments:    smokes 1 pk /wk  Vaping Use   Vaping Use: Never used  Substance Use Topics   Alcohol use: Yes    Alcohol/week: 6.0 standard drinks of alcohol    Types: 6 Glasses of wine per week   Drug use: No    Family History: The patient's family history includes Cancer - Other in his maternal grandfather and sister; Diabetes in his father, maternal aunt, and maternal grandmother; Heart disease in his father and mother. There is no history of Colon cancer, Colon polyps, Esophageal cancer, Rectal cancer, or Stomach cancer. Three sisters; one sister with high cholesterol. One sister with nasal cancer, other sister with Lewy Body dementia. Mother had high cholesterol, died age 27 (assumed MI); has stroke when she was 32. Father died age 30 of MI, had a stroke age 72-60. Only remembers one grandparent, unsure of all their history. Uncle might have had MI in 79s, not sure. No definitive early CAD, no SCD.  ROS:   Please see the history of present illness. (+) BLE pain (+) Fatigue (+) Rare palpitations (+) Numbness of left 3rd-5th fingers (+) Umbilical hernia All other systems are reviewed and negative.   EKGs/Labs/Other Studies Reviewed:    The following studies were reviewed  today:  Abdominal Aorta Study  07/31/2022: Summary:  Abdominal Aorta: There is evidence of abnormal dilatation of the distal  Abdominal aorta. The largest aortic diameter has increased compared to  prior exam. Previous diameter measurement was 4.2 cm obtained on 03/15/21.   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    Summary:  Right:  Resting right ankle-brachial index indicates mild right lower  extremity arterial disease. The right toe-brachial index is abnormal.   Left: Resting left ankle-brachial index indicates moderate left lower  extremity arterial disease. The left toe-brachial index is abnormal.    EKG:  EKG is personally reviewed.   08/08/2022:  NSR at 76 bpm 01/09/2021: NSR 02/18/2019: NSR  Recent Labs: No results found for requested labs within last 365 days.   Recent Lipid Panel No results found for: "CHOL", "TRIG", "HDL", "CHOLHDL", "VLDL", "LDLCALC", "LDLDIRECT"  Physical Exam:    VS:  BP 112/64   Pulse 76   Ht '5\' 7"'$  (1.702 m)   Wt 213 lb 9.6 oz (96.9 kg)   BMI 33.45 kg/m     Wt Readings from Last 3 Encounters:  08/08/22 213 lb 9.6 oz (96.9 kg)  07/31/22 211 lb 11.2 oz (96 kg)  03/31/21 215 lb (97.5 kg)    GEN: Well nourished, well developed in no acute distress HEENT: Normal, moist mucous membranes NECK: No JVD CARDIAC: regular rhythm, normal S1 and S2, no murmurs, rubs, gallops.  VASCULAR: Radial pulses 2+ bilaterally, and DP pulses not palpable bilaterally. No carotid bruits RESPIRATORY:  Clear to auscultation without rales, wheezing or rhonchi  ABDOMEN: Soft, non-tender, +Ventral hernia MUSCULOSKELETAL:  Ambulates independently SKIN: Warm and dry, no edema NEUROLOGIC:  Alert and oriented x 3. No focal neuro  deficits noted. PSYCHIATRIC:  Normal affect    ASSESSMENT:    1. Mixed hyperlipidemia   2. Infrarenal abdominal aortic aneurysm (AAA) without rupture ([place])   3. OSA (obstructive sleep apnea)   4. Vascular disease   5. Tobacco abuse counseling   6. Class 1 obesity due to excess calories without serious comorbidity with body mass index (BMI) of 33.0 to 33.9 in adult   7. Essential hypertension     PLAN:    AAA PAD in LE Tobacco use -counseled on tobacco use, statin, aspirin -followed by Dr. Stanford Breed -no tissue loss or rest pain. Possible claudication, but he has difficulty determining if it could be hip or knee pain as well  OSA -continue CPAP  Mixed hyperlipidemia: -had myalgias on 20 mg dose, decreased to 10 mg dose. Noted some myalgias, decreaesd and then ultimately stopped last year. He is working to restart this. If cannot tolerate/get to goal on statin, consider alternative. LDL goal <70  Hypertension -continue valsartan  Tobacco cessation: The patient was counseled on tobacco cessation today for 4 minutes.  Counseling included reviewing the risks of smoking tobacco products, how it impacts the patient's current medical diagnoses and different strategies for quitting.  Pharmacotherapy to aid in tobacco cessation was not prescribed today.   CV risk/prevention With family history of MI, high cholesterol, stroke -recommend heart healthy/Mediterranean diet, with whole grains, fruits, vegetable, fish, lean meats, nuts, and olive oil. Limit salt. -recommend moderate walking, 3-5 times/week for 30-50 minutes each session. Aim for at least 150 minutes.week. Goal should be pace of 3 miles/hours, or walking 1.5 miles in 30 minutes -recommend avoidance of tobacco products. Avoid excess alcohol.  Plan for follow up: 1 year or sooner as needed.  Buford Dresser, MD, PhD [place]  [facility]   Medication Adjustments/Labs and Tests Ordered: Current medicines are  reviewed at length with the patient today.  Concerns regarding medicines are outlined above.   Orders Placed This Encounter  Procedures   EKG 12-Lead   No orders of the defined types were placed in this encounter.  Patient Instructions  Medication Instructions:  Your physician recommends that you continue on your current medications as directed. Please refer to the Current Medication list given to you today.  *If you need a refill on your cardiac medications before your next appointment, please call your pharmacy*  Lab Work: NONE  Testing/Procedures: NONE  Follow-Up: At [facility], you and your health needs are our priority.  As part of our continuing mission to provide you with exceptional heart care, we have created designated Provider Care Teams.  These Care Teams include your primary Cardiologist (physician) and Advanced Practice Providers (APPs -  Physician Assistants and Nurse Practitioners) who all work together to provide you with the care you need, when you need it.  We recommend signing up for the patient portal called "MyChart".  Sign up information is provided on this After Visit Summary.  MyChart is used to connect with patients for Virtual Visits (Telemedicine).  Patients are able to view lab/test results, encounter notes, upcoming appointments, etc.  Non-urgent messages can be sent to your provider as well.   To learn more about what you can do with MyChart, go to [email or web address].    Your next appointment:   12 month(s)  The format for your next appointment:   In Person  Provider:   Buford Dresser, MD       [facility],acting as a scribe for Buford Dresser, MD.,have documented all relevant documentation on the behalf of Buford Dresser, MD,as directed by  Buford Dresser, MD while in the presence of Buford Dresser, MD.  I, Buford Dresser, MD, have reviewed all documentation for this visit. The  documentation on 08/08/22 for the exam, diagnosis, procedures, and orders are all accurate and complete.   Signed, Buford Dresser, MD PhD 08/08/2022 7:20 PM    [place]

## 2022-08-08 NOTE — Patient Instructions (Signed)
Medication Instructions:  Your physician recommends that you continue on your current medications as directed. Please refer to the Current Medication list given to you today.  *If you need a refill on your cardiac medications before your next appointment, please call your pharmacy*  Lab Work: NONE  Testing/Procedures: NONE  Follow-Up: At  [facility], you and your health needs are our priority.  As part of our continuing mission to provide you with exceptional heart care, we have created designated Provider Care Teams.  These Care Teams include your primary Cardiologist (physician) and Advanced Practice Providers (APPs -  Physician Assistants and Nurse Practitioners) who all work together to provide you with the care you need, when you need it.  We recommend signing up for the patient portal called "MyChart".  Sign up information is provided on this After Visit Summary.  MyChart is used to connect with patients for Virtual Visits (Telemedicine).  Patients are able to view lab/test results, encounter notes, upcoming appointments, etc.  Non-urgent messages can be sent to your provider as well.   To learn more about what you can do with MyChart, go to [email or web address].    Your next appointment:   12 month(s)  The format for your next appointment:   In Person  Provider:   Bridgette Christopher, MD     

## 2022-08-10 DIAGNOSIS — F4321 Adjustment disorder with depressed mood: Secondary | ICD-10-CM | POA: Diagnosis not present

## 2022-08-24 DIAGNOSIS — F411 Generalized anxiety disorder: Secondary | ICD-10-CM | POA: Diagnosis not present

## 2022-08-31 DIAGNOSIS — G4733 Obstructive sleep apnea (adult) (pediatric): Secondary | ICD-10-CM | POA: Diagnosis not present

## 2022-10-02 DIAGNOSIS — N5201 Erectile dysfunction due to arterial insufficiency: Secondary | ICD-10-CM | POA: Diagnosis not present

## 2022-10-02 DIAGNOSIS — R350 Frequency of micturition: Secondary | ICD-10-CM | POA: Diagnosis not present

## 2022-10-02 DIAGNOSIS — F4321 Adjustment disorder with depressed mood: Secondary | ICD-10-CM | POA: Diagnosis not present

## 2022-10-02 DIAGNOSIS — R972 Elevated prostate specific antigen [PSA]: Secondary | ICD-10-CM | POA: Diagnosis not present

## 2022-10-08 DIAGNOSIS — G4733 Obstructive sleep apnea (adult) (pediatric): Secondary | ICD-10-CM | POA: Diagnosis not present

## 2022-10-10 DIAGNOSIS — M1712 Unilateral primary osteoarthritis, left knee: Secondary | ICD-10-CM | POA: Diagnosis not present

## 2022-10-12 DIAGNOSIS — M25562 Pain in left knee: Secondary | ICD-10-CM | POA: Diagnosis not present

## 2022-10-12 DIAGNOSIS — M1712 Unilateral primary osteoarthritis, left knee: Secondary | ICD-10-CM | POA: Diagnosis not present

## 2022-10-26 DIAGNOSIS — F4321 Adjustment disorder with depressed mood: Secondary | ICD-10-CM | POA: Diagnosis not present

## 2022-11-02 DIAGNOSIS — G4733 Obstructive sleep apnea (adult) (pediatric): Secondary | ICD-10-CM | POA: Diagnosis not present

## 2022-11-02 DIAGNOSIS — M170 Bilateral primary osteoarthritis of knee: Secondary | ICD-10-CM | POA: Diagnosis not present

## 2022-11-09 DIAGNOSIS — F4321 Adjustment disorder with depressed mood: Secondary | ICD-10-CM | POA: Diagnosis not present

## 2022-11-22 DIAGNOSIS — F4321 Adjustment disorder with depressed mood: Secondary | ICD-10-CM | POA: Diagnosis not present

## 2022-11-26 DIAGNOSIS — F4321 Adjustment disorder with depressed mood: Secondary | ICD-10-CM | POA: Diagnosis not present

## 2022-11-26 DIAGNOSIS — E559 Vitamin D deficiency, unspecified: Secondary | ICD-10-CM | POA: Diagnosis not present

## 2022-11-26 DIAGNOSIS — R5381 Other malaise: Secondary | ICD-10-CM | POA: Diagnosis not present

## 2022-11-26 DIAGNOSIS — Z0000 Encounter for general adult medical examination without abnormal findings: Secondary | ICD-10-CM | POA: Diagnosis not present

## 2022-11-26 DIAGNOSIS — D696 Thrombocytopenia, unspecified: Secondary | ICD-10-CM | POA: Diagnosis not present

## 2022-11-26 DIAGNOSIS — Z13220 Encounter for screening for lipoid disorders: Secondary | ICD-10-CM | POA: Diagnosis not present

## 2022-11-27 ENCOUNTER — Ambulatory Visit (INDEPENDENT_AMBULATORY_CARE_PROVIDER_SITE_OTHER)
Admission: RE | Admit: 2022-11-27 | Discharge: 2022-11-27 | Disposition: A | Discharge status: Home / Self Care | Payer: BC Managed Care – PPO | Source: Ambulatory Visit | Attending: Vascular Surgery | Admitting: Vascular Surgery

## 2022-11-27 ENCOUNTER — Ambulatory Visit (HOSPITAL_COMMUNITY)
Admission: RE | Admit: 2022-11-27 | Discharge: 2022-11-27 | Disposition: A | Discharge status: Home / Self Care | Payer: BC Managed Care – PPO | Source: Ambulatory Visit | Attending: Vascular Surgery | Admitting: Vascular Surgery

## 2022-11-27 ENCOUNTER — Ambulatory Visit (INDEPENDENT_AMBULATORY_CARE_PROVIDER_SITE_OTHER): Payer: BC Managed Care – PPO | Admitting: Vascular Surgery

## 2022-11-27 ENCOUNTER — Telehealth: Payer: Self-pay | Admitting: Cardiology

## 2022-11-27 ENCOUNTER — Encounter: Payer: Self-pay | Admitting: Vascular Surgery

## 2022-11-27 VITALS — BP 134/71 | HR 64 | Temp 98.0°F | Wt 209.0 lb

## 2022-11-27 DIAGNOSIS — I70219 Atherosclerosis of native arteries of extremities with intermittent claudication, unspecified extremity: Secondary | ICD-10-CM

## 2022-11-27 DIAGNOSIS — I7143 Infrarenal abdominal aortic aneurysm, without rupture: Secondary | ICD-10-CM | POA: Diagnosis not present

## 2022-11-27 NOTE — Progress Notes (Signed)
[facility] [place]  ASSESSMENT / PLAN: [name] is a 62 y.o. male with an infrarenal abdominal aortic aneurysm measuring 43mm. 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  Recommend the following to reduce the risk of major adverse cardiac / limb events.  Cessation from all tobacco products. Blood glucose control with goal A1c < 7%. Blood pressure control with goal blood pressure < 140/90 mmHg. Lipid reduction therapy with goal LDL-C <100 mg/dL. Aspirin 81mg  PO QD.  Atorvastatin 40-80mg  PO QD (or other "high intensity" statin therapy).  The patient's aneurysm continues to grow slowly.  He now has intermittent claudication of bilateral lower extremities/I counseled him about the benign nature of this finding.  I encouraged him to work on walking.  He is working hard to quit smoking and I encouraged him in this endeavor.  I will see him again in 1 year with repeat noninvasive testing to monitor his claudication symptoms and aneurysm status.  CHIEF COMPLAINT: AAA  HISTORY OF PRESENT ILLNESS: [name] is a 62 y.o. male referred to clinic for evaluation of abdominal aortic aneurysm.  This was demonstrated incidentally during work-up for cause of back pain.  He has no symptoms referable to his aneurysm.  We spent the bulk of the visit discussing the natural history of aortic aneurysm disease, the rationale for intervention at 5-1/2 cm, and the options available for intervention when the size threshold is met.  07/31/22: patient returns to clinic for suveillance. He is working hard on quitting smoking. He has developed claudication in bilateral lower extremities. He reports a symptomatic umbilical hernia.   11/27/22: Returns to review noninvasive testing.  Overall reassuring appearance of studies today.  He reports to me that he is not walking very much because of severe left knee pain.  He is planned to  undergo orthopedic surgery for this.  Reports no symptoms of ischemic rest pain or ulcers about the feet.  VASCULAR SURGICAL HISTORY: none  VASCULAR RISK FACTORS: Negative history of stroke / transient ischemic attack. Negative history of coronary artery disease.  Negative history of diabetes mellitus.  Positive history of smoking. Not actively smoking - recently quit. Negative history of hypertension. . Negative history of chronic kidney disease.  Negative history of chronic obstructive pulmonary disease.  FUNCTIONAL STATUS: ECOG performance status: (0) Fully active, able to carry on all predisease performance without restriction Ambulatory status: Ambulatory within the community with limits  Past Medical History:  Diagnosis Date   Anxiety    Arthritis    Cataract    removed with surgery   Diverticulosis    HA (headache)    Hyperlipemia    Sleep apnea    Uses CPAP nightly   Tubular adenoma of colon     Past Surgical History:  Procedure Laterality Date   ADENOIDECTOMY     ANKLE ARTHROSCOPY Left    x2   CATARACT EXTRACTION Bilateral    COLONOSCOPY     polyps   KNEE ARTHROSCOPY Left    x2   MOUTH SURGERY     NASAL SEPTUM SURGERY     right leg surgery     fx fib/tib with steel rod from injury    SALIVARY GLAND SURGERY     TONSILLECTOMY     TYMPANOSTOMY     WISDOM TOOTH EXTRACTION      Family History  Problem Relation Age of Onset   Cancer - Other Sister  Cancer - Other Maternal Grandfather    Heart disease Mother    Diabetes Father    Heart disease Father    Diabetes Maternal Aunt    Diabetes Maternal Grandmother    Colon cancer Neg Hx    Colon polyps Neg Hx    Esophageal cancer Neg Hx    Rectal cancer Neg Hx    Stomach cancer Neg Hx     Social History   Socioeconomic History   Marital status: Married    Spouse name: Not on file   Number of children: Not on file   Years of education: Not on file   Highest education level: Not on file   Occupational History   Not on file  Tobacco Use   Smoking status: Every Day    Packs/day: 0.50    Years: 40.00    Additional pack years: 0.00    Total pack years: 20.00    Types: Cigarettes   Smokeless tobacco: Never   Tobacco comments:    smokes 1 pk /wk  Vaping Use   Vaping Use: Never used  Substance and Sexual Activity   Alcohol use: Yes    Alcohol/week: 6.0 standard drinks of alcohol    Types: 6 Glasses of wine per week   Drug use: No   Sexual activity: Not on file  Other Topics Concern   Not on file  Social History Narrative   Not on file   Social Determinants of Health   Financial Resource Strain: Not on file  Food Insecurity: Not on file  Transportation Needs: Not on file  Physical Activity: Not on file  Stress: Not on file  Social Connections: Not on file  Intimate Partner Violence: Not on file    Allergies  Allergen Reactions   Keflex [Cephalexin] Other (See Comments)    abd burning  And pain and diarrhea    Current Outpatient Medications  Medication Sig Dispense Refill   ALPRAZolam (XANAX) 0.25 MG tablet Take 1 tablet by mouth as directed.     LEXAPRO 20 MG tablet Take 30 mg by mouth daily.     Multiple Vitamin (MULTIVITAMIN) tablet Take 1 tablet by mouth daily.     OVER THE COUNTER MEDICATION Take 2 capsules by mouth daily. Vitamind     rosuvastatin (CRESTOR) 10 MG tablet Take 1 tablet by mouth daily.     sildenafil (VIAGRA) 100 MG tablet Take 1 tablet by mouth as directed.     silodosin (RAPAFLO) 8 MG CAPS capsule Take 8 mg by mouth at bedtime.     tadalafil (CIALIS) 5 MG tablet Take 5 mg by mouth daily as needed for erectile dysfunction.     tiZANidine (ZANAFLEX) 2 MG tablet Take 1 tablet by mouth as directed.     traMADol (ULTRAM) 50 MG tablet Take 50 mg by mouth 4 (four) times daily as needed.     valsartan (DIOVAN) 160 MG tablet Take 160 mg by mouth daily.     No current facility-administered medications for this visit.    PHYSICAL  EXAM Vitals:   11/27/22 0847  BP: 134/71  Pulse: 64  Temp: 98 F (36.7 C)  TempSrc: Temporal  SpO2: 99%  Weight: 209 lb (94.8 kg)     Well appearing No distress Regular rate and rhythm Unlabored breathing No pedal pulses palpable. Feet are warm.  PERTINENT LABORATORY AND RADIOLOGIC DATA  Most recent CBC    Latest Ref Rng & Units 10/16/2016    3:59 PM 05/08/2009  2:00 PM 04/30/2009    8:38 PM  CBC  WBC 4.0 - 10.5 K/uL 9.4  7.6    Hemoglobin 13.0 - 17.0 g/dL 16.1  09.6  04.5   Hematocrit 39.0 - 52.0 % 43.1  38.7  46.0   Platelets 150 - 400 K/uL 131  176       Most recent CMP    Latest Ref Rng & Units 10/16/2016    3:59 PM 04/30/2009    8:38 PM  CMP  Glucose 65 - 99 mg/dL 96  409   BUN 6 - 20 mg/dL 11  9   Creatinine 8.11 - 1.24 mg/dL 9.14  0.9   Sodium 782 - 145 mmol/L 135  134   Potassium 3.5 - 5.1 mmol/L 3.8  3.6   Chloride 101 - 111 mmol/L 101  104   CO2 22 - 32 mmol/L 25    Calcium 8.9 - 10.3 mg/dL 9.1    Total Protein 6.5 - 8.1 g/dL 7.4    Total Bilirubin 0.3 - 1.2 mg/dL 0.9    Alkaline Phos 38 - 126 U/L 59    AST 15 - 41 U/L 21    ALT 17 - 63 U/L 22      43 mm abdominal aortic aneurysm on duplex today   +-------+-----------+-----------+------------+------------+  ABI/TBIToday's ABIToday's TBIPrevious ABIPrevious TBI  +-------+-----------+-----------+------------+------------+  Right 0.92       0.68       0.83        0.54          +-------+-----------+-----------+------------+------------+  Left  0.64       0.43       0.64        0.44          +-------+-----------+-----------+------------+------------+    Rande Brunt. Lenell Antu, MD [facility] [place] [facility] Phone Number: [phone] 11/27/2022 12:06 PM  Total time spent on preparing this encounter including chart review, data review, collecting history, examining the patient, coordinating care for this established patient, 40 minutes.  Portions of  this report may have been transcribed using voice recognition software.  Every effort has been made to ensure accuracy; however, inadvertent computerized transcription errors may still be present.

## 2022-11-27 NOTE — Telephone Encounter (Signed)
Advised patient, verbalized understanding  Scheduled appointment with pharm D

## 2022-11-27 NOTE — Telephone Encounter (Signed)
Pt c/o medication issue:  1. Name of Medication:   rosuvastatin (CRESTOR) 10 MG tablet    2. How are you currently taking this medication (dosage and times per day)?   Take 1 tablet by mouth daily.    3. Are you having a reaction (difficulty breathing--STAT)? No  4. What is your medication issue? Pt states that his PCP suggested that medication be changed due to issues it may be causing. Pt also states he would ike to speak with nurse regarding exertion. Please advise

## 2022-11-27 NOTE — Telephone Encounter (Signed)
Per Dr. Cristal Deer last note,   "Mixed hyperlipidemia: -had myalgias on 20 mg dose, decreased to 10 mg dose. Noted some myalgias, decreaesd and then ultimately stopped last year. He is working to restart this. If cannot tolerate/get to goal on statin, consider alternative. LDL goal <70"  If not tolerating, okay to stop. Recommend referral to pharmacy lipid clinic to discuss alternatives.   Alver Sorrow, NP

## 2022-11-28 LAB — VAS US ABI WITH/WO TBI
Left ABI: 0.64
Right ABI: 0.92

## 2022-11-29 ENCOUNTER — Ambulatory Visit: Payer: BC Managed Care – PPO

## 2022-11-29 NOTE — Progress Notes (Deleted)
Patient ID: [name]                 DOB: 07/29/1960                    MRN: [id number]      HPI: [name] is a 62 y.o. male patient referred to lipid clinic by Dr.Christopher. PMH is significant for  PAD, AAA, OSA on CPAP, mixed hyperlipidemia, obesity. Patient is intolerant to rosuvastatin 20 and 10 mg  twice week -severe myalgias.   Reviewed options for lowering LDL cholesterol, including ezetimibe, PCSK-9 inhibitors, bempedoic acid and inclisiran.  Discussed mechanisms of action, dosing, side effects and potential decreases in LDL cholesterol.  Also reviewed cost information and potential options for patient assistance.  Current Medications: rosuvastatin 10 mg ??? Intolerances: rosuvastatin 20 and 10 mg  twice week -severe myalgias  Risk Factors: family hx of ASCVD, PAD,OSA,HDL LDL goal: <70  Last LDLc 146 TC 224, TG 208 HDL 40  non HDL 183 (11/26/2022)  Diet:   Exercise:   Family History: Three sisters; one sister with high cholesterol. One sister with nasal cancer, other sister with Lewy Body dementia. Mother had high cholesterol, died age 74 (assumed MI); has stroke when she was 4. Father died age 78 of MI, had a stroke age 31-60. Only remembers one grandparent, unsure of all their history. Uncle might have had MI in 30s, not sure. No definitive early CAD, no SCD   Social History:   Labs: Lipid Panel  No results found for: "CHOL", "TRIG", "HDL", "CHOLHDL", "VLDL", "LDLCALC", "LDLDIRECT", "LABVLDL"  Past Medical History:  Diagnosis Date   Anxiety    Arthritis    Cataract    removed with surgery   Diverticulosis    HA (headache)    Hyperlipemia    Sleep apnea    Uses CPAP nightly   Tubular adenoma of colon     Current Outpatient Medications on File Prior to Visit  Medication Sig Dispense Refill   ALPRAZolam (XANAX) 0.25 MG tablet Take 1 tablet by mouth as directed.     LEXAPRO 20 MG tablet Take 30 mg by mouth daily.     Multiple Vitamin  (MULTIVITAMIN) tablet Take 1 tablet by mouth daily.     OVER THE COUNTER MEDICATION Take 2 capsules by mouth daily. Vitamind     rosuvastatin (CRESTOR) 10 MG tablet Take 1 tablet by mouth daily.     sildenafil (VIAGRA) 100 MG tablet Take 1 tablet by mouth as directed.     silodosin (RAPAFLO) 8 MG CAPS capsule Take 8 mg by mouth at bedtime.     tadalafil (CIALIS) 5 MG tablet Take 5 mg by mouth daily as needed for erectile dysfunction.     tiZANidine (ZANAFLEX) 2 MG tablet Take 1 tablet by mouth as directed.     traMADol (ULTRAM) 50 MG tablet Take 50 mg by mouth 4 (four) times daily as needed.     valsartan (DIOVAN) 160 MG tablet Take 160 mg by mouth daily.     No current facility-administered medications on file prior to visit.    Allergies  Allergen Reactions   Keflex [Cephalexin] Other (See Comments)    abd burning  And pain and diarrhea    Assessment/Plan:  1. Hyperlipidemia -  No problems updated. No problem-specific Assessment & Plan notes found for this encounter.    Thank you,  Carmela Hurt, Pharm.D [facility] of [place]  [facility] [address] [place], [place] [address]  Phone: [phone]; Fax: [phone]

## 2022-12-08 ENCOUNTER — Other Ambulatory Visit: Payer: Self-pay

## 2022-12-08 DIAGNOSIS — I70219 Atherosclerosis of native arteries of extremities with intermittent claudication, unspecified extremity: Secondary | ICD-10-CM

## 2022-12-08 DIAGNOSIS — I7143 Infrarenal abdominal aortic aneurysm, without rupture: Secondary | ICD-10-CM

## 2022-12-10 DIAGNOSIS — H33302 Unspecified retinal break, left eye: Secondary | ICD-10-CM | POA: Diagnosis not present

## 2022-12-10 DIAGNOSIS — H35432 Paving stone degeneration of retina, left eye: Secondary | ICD-10-CM | POA: Diagnosis not present

## 2022-12-10 DIAGNOSIS — H43812 Vitreous degeneration, left eye: Secondary | ICD-10-CM | POA: Diagnosis not present

## 2022-12-10 DIAGNOSIS — H53142 Visual discomfort, left eye: Secondary | ICD-10-CM | POA: Diagnosis not present

## 2022-12-10 DIAGNOSIS — H35371 Puckering of macula, right eye: Secondary | ICD-10-CM | POA: Diagnosis not present

## 2022-12-10 DIAGNOSIS — H43392 Other vitreous opacities, left eye: Secondary | ICD-10-CM | POA: Diagnosis not present

## 2022-12-10 DIAGNOSIS — H33312 Horseshoe tear of retina without detachment, left eye: Secondary | ICD-10-CM | POA: Diagnosis not present

## 2022-12-11 DIAGNOSIS — H33312 Horseshoe tear of retina without detachment, left eye: Secondary | ICD-10-CM | POA: Diagnosis not present

## 2022-12-17 DIAGNOSIS — Z961 Presence of intraocular lens: Secondary | ICD-10-CM | POA: Diagnosis not present

## 2022-12-17 DIAGNOSIS — H31002 Unspecified chorioretinal scars, left eye: Secondary | ICD-10-CM | POA: Diagnosis not present

## 2022-12-20 DIAGNOSIS — F4321 Adjustment disorder with depressed mood: Secondary | ICD-10-CM | POA: Diagnosis not present

## 2022-12-31 DIAGNOSIS — H31092 Other chorioretinal scars, left eye: Secondary | ICD-10-CM | POA: Diagnosis not present

## 2022-12-31 DIAGNOSIS — H43812 Vitreous degeneration, left eye: Secondary | ICD-10-CM | POA: Diagnosis not present

## 2022-12-31 DIAGNOSIS — H43392 Other vitreous opacities, left eye: Secondary | ICD-10-CM | POA: Diagnosis not present

## 2022-12-31 DIAGNOSIS — H33312 Horseshoe tear of retina without detachment, left eye: Secondary | ICD-10-CM | POA: Diagnosis not present

## 2023-01-03 DIAGNOSIS — E782 Mixed hyperlipidemia: Secondary | ICD-10-CM | POA: Diagnosis not present

## 2023-01-03 DIAGNOSIS — H6980 Other specified disorders of Eustachian tube, unspecified ear: Secondary | ICD-10-CM | POA: Diagnosis not present

## 2023-01-03 DIAGNOSIS — H6523 Chronic serous otitis media, bilateral: Secondary | ICD-10-CM | POA: Diagnosis not present

## 2023-01-04 DIAGNOSIS — F4321 Adjustment disorder with depressed mood: Secondary | ICD-10-CM | POA: Diagnosis not present

## 2023-01-14 ENCOUNTER — Ambulatory Visit (HOSPITAL_BASED_OUTPATIENT_CLINIC_OR_DEPARTMENT_OTHER): Payer: BC Managed Care – PPO | Admitting: Family

## 2023-01-18 DIAGNOSIS — F4321 Adjustment disorder with depressed mood: Secondary | ICD-10-CM | POA: Diagnosis not present

## 2023-01-28 DIAGNOSIS — H15101 Unspecified episcleritis, right eye: Secondary | ICD-10-CM | POA: Diagnosis not present

## 2023-02-06 DIAGNOSIS — F4310 Post-traumatic stress disorder, unspecified: Secondary | ICD-10-CM | POA: Diagnosis not present

## 2023-02-06 DIAGNOSIS — E782 Mixed hyperlipidemia: Secondary | ICD-10-CM | POA: Diagnosis not present

## 2023-02-06 DIAGNOSIS — F411 Generalized anxiety disorder: Secondary | ICD-10-CM | POA: Diagnosis not present

## 2023-02-06 DIAGNOSIS — F0781 Postconcussional syndrome: Secondary | ICD-10-CM | POA: Diagnosis not present

## 2023-02-06 DIAGNOSIS — Z0000 Encounter for general adult medical examination without abnormal findings: Secondary | ICD-10-CM | POA: Diagnosis not present

## 2023-02-08 DIAGNOSIS — F411 Generalized anxiety disorder: Secondary | ICD-10-CM | POA: Diagnosis not present

## 2023-02-20 DIAGNOSIS — M1712 Unilateral primary osteoarthritis, left knee: Secondary | ICD-10-CM | POA: Diagnosis not present

## 2023-02-22 DIAGNOSIS — F411 Generalized anxiety disorder: Secondary | ICD-10-CM | POA: Diagnosis not present

## 2023-02-22 DIAGNOSIS — F4321 Adjustment disorder with depressed mood: Secondary | ICD-10-CM | POA: Diagnosis not present

## 2023-03-06 DIAGNOSIS — H43392 Other vitreous opacities, left eye: Secondary | ICD-10-CM | POA: Diagnosis not present

## 2023-03-06 DIAGNOSIS — H35372 Puckering of macula, left eye: Secondary | ICD-10-CM | POA: Diagnosis not present

## 2023-03-06 DIAGNOSIS — H43812 Vitreous degeneration, left eye: Secondary | ICD-10-CM | POA: Diagnosis not present

## 2023-03-06 DIAGNOSIS — H35431 Paving stone degeneration of retina, right eye: Secondary | ICD-10-CM | POA: Diagnosis not present

## 2023-03-19 DIAGNOSIS — F4321 Adjustment disorder with depressed mood: Secondary | ICD-10-CM | POA: Diagnosis not present

## 2023-04-03 DIAGNOSIS — H938X2 Other specified disorders of left ear: Secondary | ICD-10-CM | POA: Diagnosis not present

## 2023-04-03 DIAGNOSIS — R0981 Nasal congestion: Secondary | ICD-10-CM | POA: Diagnosis not present

## 2023-04-05 DIAGNOSIS — F4321 Adjustment disorder with depressed mood: Secondary | ICD-10-CM | POA: Diagnosis not present

## 2023-04-13 DIAGNOSIS — M25562 Pain in left knee: Secondary | ICD-10-CM | POA: Diagnosis not present

## 2023-04-13 DIAGNOSIS — M47816 Spondylosis without myelopathy or radiculopathy, lumbar region: Secondary | ICD-10-CM | POA: Diagnosis not present

## 2023-04-19 DIAGNOSIS — F4321 Adjustment disorder with depressed mood: Secondary | ICD-10-CM | POA: Diagnosis not present

## 2023-04-26 DIAGNOSIS — M1712 Unilateral primary osteoarthritis, left knee: Secondary | ICD-10-CM | POA: Diagnosis not present

## 2023-04-29 DIAGNOSIS — M25562 Pain in left knee: Secondary | ICD-10-CM | POA: Diagnosis not present

## 2023-05-02 DIAGNOSIS — M1712 Unilateral primary osteoarthritis, left knee: Secondary | ICD-10-CM | POA: Diagnosis not present

## 2023-05-03 ENCOUNTER — Telehealth: Payer: Self-pay

## 2023-05-03 NOTE — Telephone Encounter (Signed)
   Pre-operative Risk Assessment    Patient Name: [name]  DOB: 01-Jul-1960 MRN: [id number]     Request for Surgical Clearance    Procedure:  Left total knee arthroplasty   Date of Surgery:  Clearance 05/21/23                                 Surgeon:  Dr. Elvera Maria Surgeon's Group or Practice Name:  [facility] Phone number:  [phone] Fax number:  [phone]   Type of Clearance Requested:   - Pharmacy:  Hold Aspirin     Type of Anesthesia:  Choice    Additional requests/questions:    SignedVernard Gambles   05/03/2023, 4:40 PM

## 2023-05-06 ENCOUNTER — Telehealth: Payer: Self-pay | Admitting: *Deleted

## 2023-05-06 ENCOUNTER — Ambulatory Visit: Payer: BC Managed Care – PPO | Admitting: Podiatry

## 2023-05-06 ENCOUNTER — Telehealth: Payer: Self-pay | Admitting: Cardiology

## 2023-05-06 NOTE — Telephone Encounter (Signed)
   Name: [name]  DOB: 02-Sep-1960  MRN: [id number]  Primary Cardiologist: Jodelle Red, MD  Chart reviewed as part of pre-operative protocol coverage. Because of [name]'s past medical history and time since last visit, he will require a follow-up telephone visit in order to better assess preoperative cardiovascular risk.  Pre-op covering staff: - Please schedule appointment and call patient to inform them. If patient already had an upcoming appointment within acceptable timeframe, please add "pre-op clearance" to the appointment notes so provider is aware. - Please contact requesting surgeon's office via preferred method (i.e, phone, fax) to inform them of need for appointment prior to surgery.  If asymptomatic at the time of phone call, can hold aspirin x 5 to 7 days prior to procedure.  Please resume medic safe to do so.  Of note, patient takes aspirin more for PAD and is followed by [facility].  May also require clearance from their team.  Sharlene Dory, PA-C  05/06/2023, 8:13 AM

## 2023-05-06 NOTE — Addendum Note (Signed)
Addended by: Tarri Fuller on: 05/06/2023 02:03 PM   Modules accepted: Orders

## 2023-05-06 NOTE — Telephone Encounter (Signed)
Pt has been scheduled tele pre op appt 05/16/23. Med rec and consent are done.     Woods, Belisicia T1 hour ago (12:32 PM)   BW Pt returning call regarding Pre Op appt.Please advise      Note   Yassir, Butkowski, Belisicia T

## 2023-05-06 NOTE — Telephone Encounter (Signed)
Pt returning call regarding Pre-Op appt. Please advise 

## 2023-05-06 NOTE — Telephone Encounter (Signed)
Pt has been scheduled tele pre op appt 05/16/23. Med rec and consent are done.      Patient Consent for Virtual Visit        [name] has provided verbal consent on 05/06/2023 for a virtual visit (video or telephone).   CONSENT FOR VIRTUAL VISIT FOR:  [name]  By participating in this virtual visit I agree to the following:  I hereby voluntarily request, consent and authorize [facility] and its employed or contracted physicians, physician assistants, nurse practitioners or other licensed health care professionals (the Practitioner), to provide me with telemedicine health care services (the "Services") as deemed necessary by the treating Practitioner. I acknowledge and consent to receive the Services by the Practitioner via telemedicine. I understand that the telemedicine visit will involve communicating with the Practitioner through live audiovisual communication technology and the disclosure of certain medical information by electronic transmission. I acknowledge that I have been given the opportunity to request an in-person assessment or other available alternative prior to the telemedicine visit and am voluntarily participating in the telemedicine visit.  I understand that I have the right to withhold or withdraw my consent to the use of telemedicine in the course of my care at any time, without affecting my right to future care or treatment, and that the Practitioner or I may terminate the telemedicine visit at any time. I understand that I have the right to inspect all information obtained and/or recorded in the course of the telemedicine visit and may receive copies of available information for a reasonable fee.  I understand that some of the potential risks of receiving the Services via telemedicine include:  Delay or interruption in medical evaluation due to technological equipment failure or disruption; Information transmitted may not be sufficient (e.g. poor  resolution of images) to allow for appropriate medical decision making by the Practitioner; and/or  In rare instances, security protocols could fail, causing a breach of personal health information.  Furthermore, I acknowledge that it is my responsibility to provide information about my medical history, conditions and care that is complete and accurate to the best of my ability. I acknowledge that Practitioner's advice, recommendations, and/or decision may be based on factors not within their control, such as incomplete or inaccurate data provided by me or distortions of diagnostic images or specimens that may result from electronic transmissions. I understand that the practice of medicine is not an exact science and that Practitioner makes no warranties or guarantees regarding treatment outcomes. I acknowledge that a copy of this consent can be made available to me via my patient portal [facility] MyChart), or I can request a printed copy by calling the office of [facility].    I understand that my insurance will be billed for this visit.   I have read or had this consent read to me. I understand the contents of this consent, which adequately explains the benefits and risks of the Services being provided via telemedicine.  I have been provided ample opportunity to ask questions regarding this consent and the Services and have had my questions answered to my satisfaction. I give my informed consent for the services to be provided through the use of telemedicine in my medical care

## 2023-05-06 NOTE — Telephone Encounter (Signed)
Left message to call back to schedule tele pre op appt.

## 2023-05-14 DIAGNOSIS — Z0189 Encounter for other specified special examinations: Secondary | ICD-10-CM | POA: Diagnosis not present

## 2023-05-15 NOTE — Progress Notes (Unsigned)
Virtual Visit via Telephone Note   Because of [name]'s co-morbid illnesses, he is at least at moderate risk for complications without adequate follow up.  This format is felt to be most appropriate for this patient at this time.  The patient did not have access to video technology/had technical difficulties with video requiring transitioning to audio format only (telephone).  All issues noted in this document were discussed and addressed.  No physical exam could be performed with this format.  Please refer to the patient's chart for his consent to telehealth for [facility].  Evaluation Performed:  Preoperative cardiovascular risk assessment _____________   Date:  05/15/2023   Patient ID:  [name], DOB [birth year], MRN [id number] Patient Location:  Home Provider location:   Office  Primary Care Provider:  Daisy Floro, MD Primary Cardiologist:  Eric Red, MD  Chief Complaint / Patient Profile   62 y.o. y/o male with a h/o hyperlipidemia who is pending left total knee arthroplasty and presents today for telephonic preoperative cardiovascular risk assessment.  History of Present Illness    [name] is a 63 y.o. male who presents via audio/video conferencing for a telehealth visit today.  Pt was last seen in cardiology clinic on 08/08/2022 by Dr. Cristal Ellis.  At that time [name] was doing well .  The patient is now pending procedure as outlined above. Since his last visit, he remains stable from a cardiac standpoint.  Today he denies chest pain, shortness of breath, lower extremity edema, fatigue, palpitations, melena, hematuria, hemoptysis, diaphoresis, weakness, presyncope, syncope, orthopnea, and PND.   Past Medical History    Past Medical History:  Diagnosis Date   Anxiety    Arthritis    Cataract    removed with surgery   Diverticulosis    HA (headache)    Hyperlipemia    Sleep apnea    Uses CPAP nightly    Tubular adenoma of colon    Past Surgical History:  Procedure Laterality Date   ADENOIDECTOMY     ANKLE ARTHROSCOPY Left    x2   CATARACT EXTRACTION Bilateral    COLONOSCOPY     polyps   KNEE ARTHROSCOPY Left    x2   MOUTH SURGERY     NASAL SEPTUM SURGERY     right leg surgery     fx fib/tib with steel rod from injury    SALIVARY GLAND SURGERY     TONSILLECTOMY     TYMPANOSTOMY     WISDOM TOOTH EXTRACTION      Allergies  Allergies  Allergen Reactions   Keflex [Cephalexin] Other (See Comments)    abd burning  And pain and diarrhea    Home Medications    Prior to Admission medications   Medication Sig Start Date End Date Taking? Authorizing Provider  ALPRAZolam Prudy Feeler) 0.25 MG tablet Take 1 tablet by mouth as directed. 01/28/19   [provider]  buPROPion (WELLBUTRIN XL) 150 MG 24 hr tablet Take 150 mg by mouth daily. 02/06/23   [provider]  doxycycline (VIBRAMYCIN) 100 MG capsule Take 100 mg by mouth 2 (two) times daily. 04/29/23   [provider]  fluticasone (FLONASE) 50 MCG/ACT nasal spray Place 2 sprays into both nostrils daily. 04/03/23 04/02/24  [provider]  LEXAPRO 20 MG tablet Take 30 mg by mouth daily. 04/07/15   [provider]  Multiple Vitamin (MULTIVITAMIN) tablet Take 1 tablet by mouth daily.  [provider]  OVER THE COUNTER MEDICATION Take 2 capsules by mouth daily. Vitamind    [provider]  rosuvastatin (CRESTOR) 10 MG tablet Take 1 tablet by mouth daily. 01/06/19   [provider]  sildenafil (VIAGRA) 100 MG tablet Take 1 tablet by mouth as directed. 01/20/19   [provider]  silodosin (RAPAFLO) 8 MG CAPS capsule Take 8 mg by mouth at bedtime.    [provider]  tadalafil (CIALIS) 5 MG tablet Take 5 mg by mouth daily as needed for erectile dysfunction.    [provider]  tiZANidine (ZANAFLEX) 2 MG tablet Take 1 tablet by mouth as directed.  11/13/18   [provider]  traMADol (ULTRAM) 50 MG tablet Take 50 mg by mouth 4 (four) times daily as needed. 03/06/21   [provider]  valsartan (DIOVAN) 160 MG tablet Take 160 mg by mouth daily.    [provider]    Physical Exam    Vital Signs:  [name] does not have vital signs available for review today.  Given telephonic nature of communication, physical exam is limited. AAOx3. NAD. Normal affect.  Speech and respirations are unlabored.  Accessory Clinical Findings    None  Assessment & Plan    1.  Preoperative Cardiovascular Risk Assessment:  Left total knee arthroplasty    Date of Surgery:  Clearance 05/21/23                                 Surgeon:  Dr. Elvera Ellis Surgeon's Group or Practice Name:  [facility] Phone number:  [phone] Fax number:  [phone]      Primary Cardiologist: Eric Red, MD  Chart reviewed as part of pre-operative protocol coverage. Given past medical history and time since last visit, based on ACC/AHA guidelines, [name] would be at acceptable risk for the planned procedure without further cardiovascular testing.   His RCRI is very low, 0.4% risk of major cardiac event.  He is able to complete greater than 4 METS of physical activity.  Patient was advised that if he develops new symptoms prior to surgery to contact our office to arrange a follow-up appointment.  He verbalized understanding.    Patient's aspirin is prescribed by vein and vascular surgery.  Recommendations for holding aspirin will need to come from prescribing provider.  I will route this recommendation to the requesting party via Epic fax function and remove from pre-op pool.       Time:   Today, I have spent 5 minutes with the patient with telehealth technology discussing medical history, symptoms, and management plan.     Eric Asters, NP  05/15/2023, 8:40 AM    Prior to patient's phone  evaluation I spent greater than 10 minutes reviewing their past medical history and cardiac medications.

## 2023-05-16 ENCOUNTER — Ambulatory Visit: Payer: BC Managed Care – PPO | Attending: Internal Medicine

## 2023-05-16 DIAGNOSIS — Z01810 Encounter for preprocedural cardiovascular examination: Secondary | ICD-10-CM

## 2023-05-17 DIAGNOSIS — F4321 Adjustment disorder with depressed mood: Secondary | ICD-10-CM | POA: Diagnosis not present

## 2023-05-28 DIAGNOSIS — M1712 Unilateral primary osteoarthritis, left knee: Secondary | ICD-10-CM | POA: Diagnosis not present

## 2023-06-10 DIAGNOSIS — G4733 Obstructive sleep apnea (adult) (pediatric): Secondary | ICD-10-CM | POA: Diagnosis not present

## 2023-06-21 DIAGNOSIS — F4321 Adjustment disorder with depressed mood: Secondary | ICD-10-CM | POA: Diagnosis not present

## 2023-07-12 DIAGNOSIS — F4321 Adjustment disorder with depressed mood: Secondary | ICD-10-CM | POA: Diagnosis not present

## 2023-07-17 DIAGNOSIS — G4733 Obstructive sleep apnea (adult) (pediatric): Secondary | ICD-10-CM | POA: Diagnosis not present

## 2023-07-18 DIAGNOSIS — N401 Enlarged prostate with lower urinary tract symptoms: Secondary | ICD-10-CM | POA: Diagnosis not present

## 2023-07-18 DIAGNOSIS — R350 Frequency of micturition: Secondary | ICD-10-CM | POA: Diagnosis not present

## 2023-07-25 DIAGNOSIS — N5201 Erectile dysfunction due to arterial insufficiency: Secondary | ICD-10-CM | POA: Diagnosis not present

## 2023-07-25 DIAGNOSIS — R972 Elevated prostate specific antigen [PSA]: Secondary | ICD-10-CM | POA: Diagnosis not present

## 2023-07-25 DIAGNOSIS — R350 Frequency of micturition: Secondary | ICD-10-CM | POA: Diagnosis not present

## 2023-08-02 DIAGNOSIS — F4321 Adjustment disorder with depressed mood: Secondary | ICD-10-CM | POA: Diagnosis not present

## 2023-08-05 ENCOUNTER — Ambulatory Visit (HOSPITAL_BASED_OUTPATIENT_CLINIC_OR_DEPARTMENT_OTHER): Payer: BC Managed Care – PPO | Admitting: Cardiology

## 2023-08-15 ENCOUNTER — Ambulatory Visit (INDEPENDENT_AMBULATORY_CARE_PROVIDER_SITE_OTHER): Payer: BC Managed Care – PPO | Admitting: Podiatry

## 2023-08-15 ENCOUNTER — Encounter: Payer: Self-pay | Admitting: Podiatry

## 2023-08-15 DIAGNOSIS — B351 Tinea unguium: Secondary | ICD-10-CM | POA: Diagnosis not present

## 2023-08-15 DIAGNOSIS — I999 Unspecified disorder of circulatory system: Secondary | ICD-10-CM

## 2023-08-15 DIAGNOSIS — M79674 Pain in right toe(s): Secondary | ICD-10-CM | POA: Diagnosis not present

## 2023-08-15 DIAGNOSIS — M79675 Pain in left toe(s): Secondary | ICD-10-CM

## 2023-08-15 NOTE — Progress Notes (Signed)
 Patient presents with a subjective:   Patient ID: [name], male   DOB: 63 y.o.   MRN: [id number]   HPI Lot of pain in the left big toe around the toenails with nail disease 1-5 both feet that he has trouble taking care of of with history of vascular disease left over right   ROS      Objective:  Physical Exam  Neuro vascular status indicates diminishment of circulatory status bilateral with the left being worse than the right with elongated incurvated nailbeds 1-5 both feet with the left hallux nail being very tender on the borders     Assessment:  Mycotic nail infection 1-5 both feet with structural damage of the beds with vascular disease and ingrown component of the left hallux medial lateral borders     Plan:  H&P reviewed and discussed again his vascular circulatory status and today I went ahead debrided nailbeds 1-5 both feet no intra genic bleeding will be seen back and will pursue vascular if symptoms were to worsen at all at this time

## 2023-08-16 DIAGNOSIS — F419 Anxiety disorder, unspecified: Secondary | ICD-10-CM | POA: Diagnosis not present

## 2023-08-16 DIAGNOSIS — F4310 Post-traumatic stress disorder, unspecified: Secondary | ICD-10-CM | POA: Diagnosis not present

## 2023-08-22 ENCOUNTER — Encounter: Payer: Self-pay | Admitting: Podiatry

## 2023-08-22 ENCOUNTER — Ambulatory Visit: Admitting: Podiatry

## 2023-08-22 DIAGNOSIS — I999 Unspecified disorder of circulatory system: Secondary | ICD-10-CM | POA: Diagnosis not present

## 2023-08-22 DIAGNOSIS — L600 Ingrowing nail: Secondary | ICD-10-CM | POA: Diagnosis not present

## 2023-08-22 NOTE — Progress Notes (Signed)
 Subjective:   Patient ID: [name], male   DOB: 63 y.o.   MRN: [id number]   HPI Patient presents stating he was concerned about his left big toenail having infection with occasional history of burning around the side of the toe nailbed neuro   ROS      Objective:  Physical Exam  Ocular status intact no indications of drainage erythema edema noted with localized irritation of the bed but no other pathology except for circulatory diminishment     Assessment:  Patient probably has moderate vascular disease but no indications of gangrene or skin damage with mild nail disease     Plan:  H&P reviewed do not recommend further treatments but if symptoms persist eventually in the future we may need to get further vascular studies but not necessary now and also if any drainage were to occur pathology he will see [place] immediately

## 2023-08-30 DIAGNOSIS — F4321 Adjustment disorder with depressed mood: Secondary | ICD-10-CM | POA: Diagnosis not present

## 2023-09-02 DIAGNOSIS — H33312 Horseshoe tear of retina without detachment, left eye: Secondary | ICD-10-CM | POA: Diagnosis not present

## 2023-09-02 DIAGNOSIS — H26491 Other secondary cataract, right eye: Secondary | ICD-10-CM | POA: Diagnosis not present

## 2023-09-02 DIAGNOSIS — H35373 Puckering of macula, bilateral: Secondary | ICD-10-CM | POA: Diagnosis not present

## 2023-09-12 DIAGNOSIS — G4733 Obstructive sleep apnea (adult) (pediatric): Secondary | ICD-10-CM | POA: Diagnosis not present

## 2023-09-12 DIAGNOSIS — I10 Essential (primary) hypertension: Secondary | ICD-10-CM | POA: Diagnosis not present

## 2023-09-13 DIAGNOSIS — F4321 Adjustment disorder with depressed mood: Secondary | ICD-10-CM | POA: Diagnosis not present

## 2023-09-16 DIAGNOSIS — M79675 Pain in left toe(s): Secondary | ICD-10-CM | POA: Diagnosis not present

## 2023-10-10 DIAGNOSIS — Z96652 Presence of left artificial knee joint: Secondary | ICD-10-CM | POA: Diagnosis not present

## 2023-10-10 DIAGNOSIS — Z5189 Encounter for other specified aftercare: Secondary | ICD-10-CM | POA: Diagnosis not present

## 2023-10-22 DIAGNOSIS — M25662 Stiffness of left knee, not elsewhere classified: Secondary | ICD-10-CM | POA: Diagnosis not present

## 2023-10-22 DIAGNOSIS — M25562 Pain in left knee: Secondary | ICD-10-CM | POA: Diagnosis not present

## 2023-10-24 DIAGNOSIS — M25562 Pain in left knee: Secondary | ICD-10-CM | POA: Diagnosis not present

## 2023-10-24 DIAGNOSIS — M25662 Stiffness of left knee, not elsewhere classified: Secondary | ICD-10-CM | POA: Diagnosis not present

## 2023-10-25 DIAGNOSIS — F4321 Adjustment disorder with depressed mood: Secondary | ICD-10-CM | POA: Diagnosis not present

## 2023-10-29 DIAGNOSIS — M25562 Pain in left knee: Secondary | ICD-10-CM | POA: Diagnosis not present

## 2023-10-29 DIAGNOSIS — M25662 Stiffness of left knee, not elsewhere classified: Secondary | ICD-10-CM | POA: Diagnosis not present

## 2023-10-31 DIAGNOSIS — M25662 Stiffness of left knee, not elsewhere classified: Secondary | ICD-10-CM | POA: Diagnosis not present

## 2023-11-05 DIAGNOSIS — M25562 Pain in left knee: Secondary | ICD-10-CM | POA: Diagnosis not present

## 2023-11-05 DIAGNOSIS — M25662 Stiffness of left knee, not elsewhere classified: Secondary | ICD-10-CM | POA: Diagnosis not present

## 2023-11-07 DIAGNOSIS — M25662 Stiffness of left knee, not elsewhere classified: Secondary | ICD-10-CM | POA: Diagnosis not present

## 2023-11-07 DIAGNOSIS — G4733 Obstructive sleep apnea (adult) (pediatric): Secondary | ICD-10-CM | POA: Diagnosis not present

## 2023-11-14 DIAGNOSIS — B085 Enteroviral vesicular pharyngitis: Secondary | ICD-10-CM | POA: Diagnosis not present

## 2023-11-14 DIAGNOSIS — Z6832 Body mass index (BMI) 32.0-32.9, adult: Secondary | ICD-10-CM | POA: Diagnosis not present

## 2023-11-20 DIAGNOSIS — M25562 Pain in left knee: Secondary | ICD-10-CM | POA: Diagnosis not present

## 2023-11-20 DIAGNOSIS — M25662 Stiffness of left knee, not elsewhere classified: Secondary | ICD-10-CM | POA: Diagnosis not present

## 2023-11-22 DIAGNOSIS — M25662 Stiffness of left knee, not elsewhere classified: Secondary | ICD-10-CM | POA: Diagnosis not present

## 2023-11-26 DIAGNOSIS — M25662 Stiffness of left knee, not elsewhere classified: Secondary | ICD-10-CM | POA: Diagnosis not present

## 2023-11-28 DIAGNOSIS — M25662 Stiffness of left knee, not elsewhere classified: Secondary | ICD-10-CM | POA: Diagnosis not present

## 2023-11-28 DIAGNOSIS — M25562 Pain in left knee: Secondary | ICD-10-CM | POA: Diagnosis not present

## 2023-11-29 ENCOUNTER — Encounter: Payer: Self-pay | Admitting: Podiatry

## 2023-11-29 ENCOUNTER — Ambulatory Visit: Admitting: Podiatry

## 2023-11-29 DIAGNOSIS — L600 Ingrowing nail: Secondary | ICD-10-CM | POA: Diagnosis not present

## 2023-11-29 DIAGNOSIS — I999 Unspecified disorder of circulatory system: Secondary | ICD-10-CM | POA: Diagnosis not present

## 2023-12-02 NOTE — Progress Notes (Signed)
 Subjective:   Patient ID: [name], male   DOB: 63 y.o.   MRN: [id number]   HPI Patient presents with a continuation of vascular disease left and a lot of discomfort in the medial border of the left big toenail that is hard for him to wear shoe gear with any degree of comfort   ROS      Objective:  Physical Exam  Vascular status diminished both PT and DP pulses left over right with a incurvated medial border left big toe that gets tender     Assessment:  Patient has 2 conditions with 1 being vascular disease and secondarily ingrown toenail deformity of the left hallux medial border painful and chronic     Plan:  H&P reviewed both conditions discussed vascular disease the importance of exercise and stopping smoking and he is still trying I did recommend he continues to see vascular doctor as needed and for the left big toe I infiltrated 60 mg like Marcaine  mixture sterile prep done and using sterile instrumentation I remove the medial border and applied sterile dressing and instructed on soaks and reappoint as needed

## 2023-12-03 DIAGNOSIS — M25662 Stiffness of left knee, not elsewhere classified: Secondary | ICD-10-CM | POA: Diagnosis not present

## 2023-12-03 DIAGNOSIS — M25562 Pain in left knee: Secondary | ICD-10-CM | POA: Diagnosis not present

## 2023-12-04 DIAGNOSIS — K080 Exfoliation of teeth due to systemic causes: Secondary | ICD-10-CM | POA: Diagnosis not present

## 2023-12-05 DIAGNOSIS — M25562 Pain in left knee: Secondary | ICD-10-CM | POA: Diagnosis not present

## 2023-12-05 DIAGNOSIS — M25662 Stiffness of left knee, not elsewhere classified: Secondary | ICD-10-CM | POA: Diagnosis not present

## 2023-12-05 DIAGNOSIS — Z5189 Encounter for other specified aftercare: Secondary | ICD-10-CM | POA: Diagnosis not present

## 2023-12-05 DIAGNOSIS — Z96652 Presence of left artificial knee joint: Secondary | ICD-10-CM | POA: Diagnosis not present

## 2023-12-06 DIAGNOSIS — F4321 Adjustment disorder with depressed mood: Secondary | ICD-10-CM | POA: Diagnosis not present

## 2023-12-12 DIAGNOSIS — M25662 Stiffness of left knee, not elsewhere classified: Secondary | ICD-10-CM | POA: Diagnosis not present

## 2023-12-12 DIAGNOSIS — M25562 Pain in left knee: Secondary | ICD-10-CM | POA: Diagnosis not present

## 2023-12-18 DIAGNOSIS — H52203 Unspecified astigmatism, bilateral: Secondary | ICD-10-CM | POA: Diagnosis not present

## 2023-12-19 DIAGNOSIS — F4321 Adjustment disorder with depressed mood: Secondary | ICD-10-CM | POA: Diagnosis not present

## 2023-12-25 DIAGNOSIS — M25662 Stiffness of left knee, not elsewhere classified: Secondary | ICD-10-CM | POA: Diagnosis not present

## 2023-12-25 DIAGNOSIS — M25562 Pain in left knee: Secondary | ICD-10-CM | POA: Diagnosis not present

## 2023-12-27 DIAGNOSIS — M25562 Pain in left knee: Secondary | ICD-10-CM | POA: Diagnosis not present

## 2023-12-27 DIAGNOSIS — M25662 Stiffness of left knee, not elsewhere classified: Secondary | ICD-10-CM | POA: Diagnosis not present

## 2024-01-07 DIAGNOSIS — M25662 Stiffness of left knee, not elsewhere classified: Secondary | ICD-10-CM | POA: Diagnosis not present

## 2024-01-07 DIAGNOSIS — M25562 Pain in left knee: Secondary | ICD-10-CM | POA: Diagnosis not present

## 2024-01-09 DIAGNOSIS — H26492 Other secondary cataract, left eye: Secondary | ICD-10-CM | POA: Diagnosis not present

## 2024-01-10 DIAGNOSIS — F4321 Adjustment disorder with depressed mood: Secondary | ICD-10-CM | POA: Diagnosis not present

## 2024-01-14 DIAGNOSIS — M25562 Pain in left knee: Secondary | ICD-10-CM | POA: Diagnosis not present

## 2024-01-14 DIAGNOSIS — M25662 Stiffness of left knee, not elsewhere classified: Secondary | ICD-10-CM | POA: Diagnosis not present

## 2024-02-05 DIAGNOSIS — I10 Essential (primary) hypertension: Secondary | ICD-10-CM | POA: Diagnosis not present

## 2024-02-05 DIAGNOSIS — H02413 Mechanical ptosis of bilateral eyelids: Secondary | ICD-10-CM | POA: Diagnosis not present

## 2024-02-05 DIAGNOSIS — Z125 Encounter for screening for malignant neoplasm of prostate: Secondary | ICD-10-CM | POA: Diagnosis not present

## 2024-02-05 DIAGNOSIS — H0279 Other degenerative disorders of eyelid and periocular area: Secondary | ICD-10-CM | POA: Diagnosis not present

## 2024-02-05 DIAGNOSIS — E559 Vitamin D deficiency, unspecified: Secondary | ICD-10-CM | POA: Diagnosis not present

## 2024-02-05 DIAGNOSIS — E782 Mixed hyperlipidemia: Secondary | ICD-10-CM | POA: Diagnosis not present

## 2024-02-05 DIAGNOSIS — H02411 Mechanical ptosis of right eyelid: Secondary | ICD-10-CM | POA: Diagnosis not present

## 2024-02-05 DIAGNOSIS — H02831 Dermatochalasis of right upper eyelid: Secondary | ICD-10-CM | POA: Diagnosis not present

## 2024-02-05 DIAGNOSIS — H02412 Mechanical ptosis of left eyelid: Secondary | ICD-10-CM | POA: Diagnosis not present

## 2024-02-05 DIAGNOSIS — D696 Thrombocytopenia, unspecified: Secondary | ICD-10-CM | POA: Diagnosis not present

## 2024-02-07 DIAGNOSIS — G4733 Obstructive sleep apnea (adult) (pediatric): Secondary | ICD-10-CM | POA: Diagnosis not present

## 2024-02-11 DIAGNOSIS — T7840XA Allergy, unspecified, initial encounter: Secondary | ICD-10-CM | POA: Diagnosis not present

## 2024-02-11 DIAGNOSIS — H53483 Generalized contraction of visual field, bilateral: Secondary | ICD-10-CM | POA: Diagnosis not present

## 2024-02-11 DIAGNOSIS — T63441A Toxic effect of venom of bees, accidental (unintentional), initial encounter: Secondary | ICD-10-CM | POA: Diagnosis not present

## 2024-02-13 DIAGNOSIS — I10 Essential (primary) hypertension: Secondary | ICD-10-CM | POA: Diagnosis not present

## 2024-02-13 DIAGNOSIS — F4310 Post-traumatic stress disorder, unspecified: Secondary | ICD-10-CM | POA: Diagnosis not present

## 2024-02-13 DIAGNOSIS — R972 Elevated prostate specific antigen [PSA]: Secondary | ICD-10-CM | POA: Diagnosis not present

## 2024-02-13 DIAGNOSIS — F411 Generalized anxiety disorder: Secondary | ICD-10-CM | POA: Diagnosis not present

## 2024-02-13 DIAGNOSIS — Z0000 Encounter for general adult medical examination without abnormal findings: Secondary | ICD-10-CM | POA: Diagnosis not present

## 2024-02-14 DIAGNOSIS — F4321 Adjustment disorder with depressed mood: Secondary | ICD-10-CM | POA: Diagnosis not present

## 2024-02-24 DIAGNOSIS — N5201 Erectile dysfunction due to arterial insufficiency: Secondary | ICD-10-CM | POA: Diagnosis not present

## 2024-02-24 DIAGNOSIS — R972 Elevated prostate specific antigen [PSA]: Secondary | ICD-10-CM | POA: Diagnosis not present

## 2024-02-24 DIAGNOSIS — R350 Frequency of micturition: Secondary | ICD-10-CM | POA: Diagnosis not present

## 2024-02-28 ENCOUNTER — Telehealth: Payer: Self-pay

## 2024-02-28 NOTE — Telephone Encounter (Signed)
   Name: [name]  DOB: 1960-11-28  MRN: [id number]  Primary Cardiologist: Shelda Bruckner, MD  Chart reviewed as part of pre-operative protocol coverage. Because of Leul R Marinaro's past medical history and time since last visit, he will require a follow-up telephone visit in order to better assess preoperative cardiovascular risk.  Pre-op covering staff: - Please schedule appointment and call patient to inform them. If patient already had an upcoming appointment within acceptable timeframe, please add pre-op clearance to the appointment notes so provider is aware. - Please contact requesting surgeon's office via preferred method (i.e, phone, fax) to inform them of need for appointment prior to surgery.  No medications indicated as needing held.  The patient does live in [place] .  Orren LOISE Fabry, PA-C  02/28/2024, 10:35 AM

## 2024-02-28 NOTE — Telephone Encounter (Signed)
 1st attempt : 02/28/24 Called patient, NA left message to contact our office to scheduled telehealth visit.

## 2024-02-28 NOTE — Telephone Encounter (Signed)
 Spoke with pt regarding VV for preop clearance. Pt asked for a call back next Tuesday.

## 2024-02-28 NOTE — Telephone Encounter (Signed)
   Pre-operative Risk Assessment    Patient Name: [name]  DOB: 07-26-1960 MRN: [id number]   Date of last office visit: 08/08/22 Date of next office visit: none   Request for Surgical Clearance    Procedure:  Bilateral upper eyelid blepharoptosis repair with Bilateral Upper eyelid Lash ptosis repair CC Bilateral upper eyelid blepharoplasty  Date of Surgery:  Clearance 04/07/24                                 Surgeon:  Dr.Renzo Zaldivar Surgeon's Group or Practice Name:  [facility] Phone number:  [phone] Fax number:  [phone]   Type of Clearance Requested:   - Medical    Type of Anesthesia:  Not Indicated   Additional requests/questions:  N/A  SignedMerlynn LITTIE Essex   02/28/2024, 8:46 AM

## 2024-03-05 DIAGNOSIS — K080 Exfoliation of teeth due to systemic causes: Secondary | ICD-10-CM | POA: Diagnosis not present

## 2024-03-06 DIAGNOSIS — F4321 Adjustment disorder with depressed mood: Secondary | ICD-10-CM | POA: Diagnosis not present

## 2024-03-12 NOTE — Telephone Encounter (Signed)
 3rd attempt: Called patient, NA, left message on VM to contact our office to schedule a telehealth visit for cardiac clearance.

## 2024-03-25 DIAGNOSIS — Z23 Encounter for immunization: Secondary | ICD-10-CM | POA: Diagnosis not present

## 2024-04-02 ENCOUNTER — Other Ambulatory Visit: Payer: Self-pay | Admitting: *Deleted

## 2024-04-02 DIAGNOSIS — I7143 Infrarenal abdominal aortic aneurysm, without rupture: Secondary | ICD-10-CM

## 2024-04-02 DIAGNOSIS — I70219 Atherosclerosis of native arteries of extremities with intermittent claudication, unspecified extremity: Secondary | ICD-10-CM

## 2024-04-03 DIAGNOSIS — F4321 Adjustment disorder with depressed mood: Secondary | ICD-10-CM | POA: Diagnosis not present

## 2024-04-28 ENCOUNTER — Ambulatory Visit (HOSPITAL_BASED_OUTPATIENT_CLINIC_OR_DEPARTMENT_OTHER)
Admission: RE | Admit: 2024-04-28 | Discharge: 2024-04-28 | Disposition: A | Discharge status: Home / Self Care | Source: Ambulatory Visit | Attending: Vascular Surgery | Admitting: Vascular Surgery

## 2024-04-28 ENCOUNTER — Ambulatory Visit (HOSPITAL_COMMUNITY)
Admission: RE | Admit: 2024-04-28 | Discharge: 2024-04-28 | Disposition: A | Discharge status: Home / Self Care | Source: Ambulatory Visit | Attending: Vascular Surgery | Admitting: Vascular Surgery

## 2024-04-28 DIAGNOSIS — I7143 Infrarenal abdominal aortic aneurysm, without rupture: Secondary | ICD-10-CM | POA: Diagnosis not present

## 2024-04-28 DIAGNOSIS — I70219 Atherosclerosis of native arteries of extremities with intermittent claudication, unspecified extremity: Secondary | ICD-10-CM | POA: Insufficient documentation

## 2024-04-28 LAB — VAS US ABI WITH/WO TBI
Left ABI: 0.61
Right ABI: 0.8

## 2024-04-29 DIAGNOSIS — Z6833 Body mass index (BMI) 33.0-33.9, adult: Secondary | ICD-10-CM | POA: Diagnosis not present

## 2024-04-29 DIAGNOSIS — R059 Cough, unspecified: Secondary | ICD-10-CM | POA: Diagnosis not present

## 2024-04-29 DIAGNOSIS — Z03818 Encounter for observation for suspected exposure to other biological agents ruled out: Secondary | ICD-10-CM | POA: Diagnosis not present

## 2024-04-29 DIAGNOSIS — J019 Acute sinusitis, unspecified: Secondary | ICD-10-CM | POA: Diagnosis not present

## 2024-05-11 NOTE — Progress Notes (Unsigned)
 [facility] [place]  ASSESSMENT / PLAN: [name] is a 63 y.o. male with an infrarenal abdominal aortic aneurysm measuring 48mm. 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  Recommend the following to reduce the risk of major adverse cardiac / limb events.  Cessation from all tobacco products. Blood glucose control with goal A1c < 7%. Blood pressure control with goal blood pressure < 140/90 mmHg. Lipid reduction therapy with goal LDL-C <100 mg/dL. Aspirin 81mg  PO QD.  Atorvastatin 40-80mg  PO QD (or other high intensity statin therapy).  The patient's aneurysm continues to grow slowly.  Claudication symptoms are stable.  He is still smoking, but knows he needs to quit.  I will see him again in 1 year with repeat noninvasive testing to monitor his claudication symptoms and aneurysm status.  CHIEF COMPLAINT: AAA  HISTORY OF PRESENT ILLNESS: [name] is a 63 y.o. male referred to clinic for evaluation of abdominal aortic aneurysm.  This was demonstrated incidentally during work-up for cause of back pain.  He has no symptoms referable to his aneurysm.  We spent the bulk of the visit discussing the natural history of aortic aneurysm disease, the rationale for intervention at 5-1/2 cm, and the options available for intervention when the size threshold is met.  07/31/22: patient returns to clinic for suveillance. He is working hard on quitting smoking. He has developed claudication in bilateral lower extremities. He reports a symptomatic umbilical hernia.   11/27/22: Returns to review noninvasive testing.  Overall reassuring appearance of studies today.  He reports to me that he is not walking very much because of severe left knee pain.  He is planned to undergo orthopedic surgery for this.  Reports no symptoms of ischemic rest pain or ulcers about the feet.  05/12/24: Doing well overall.  He reports  fairly stable claudication symptoms in his buttock.  He underwent knee replacement surgery and is doing well from this.  He reports occasional pressure sore/blister that will develop on his foot and heal quickly.  No ischemic ulcers.  No rest pain.  We reviewed noninvasive testing.  VASCULAR SURGICAL HISTORY: none  VASCULAR RISK FACTORS: Negative history of stroke / transient ischemic attack. Negative history of coronary artery disease.  Negative history of diabetes mellitus.  Positive history of smoking. Not actively smoking - recently quit. Negative history of hypertension. . Negative history of chronic kidney disease.  Negative history of chronic obstructive pulmonary disease.  FUNCTIONAL STATUS: ECOG performance status: (0) Fully active, able to carry on all predisease performance without restriction Ambulatory status: Ambulatory within the community with limits  Past Medical History:  Diagnosis Date   Anxiety    Arthritis    Cataract    removed with surgery   Diverticulosis    HA (headache)    Hyperlipemia    Sleep apnea    Uses CPAP nightly   Tubular adenoma of colon     Past Surgical History:  Procedure Laterality Date   ADENOIDECTOMY     ANKLE ARTHROSCOPY Left    x2   CATARACT EXTRACTION Bilateral    COLONOSCOPY     polyps   JOINT REPLACEMENT Left    KNEE ARTHROSCOPY Left    x2   MOUTH SURGERY     NASAL SEPTUM SURGERY     right leg surgery     fx fib/tib with steel rod from injury    SALIVARY GLAND SURGERY  TONSILLECTOMY     TYMPANOSTOMY     WISDOM TOOTH EXTRACTION      Family History  Problem Relation Age of Onset   Cancer - Other Sister    Cancer - Other Maternal Grandfather    Heart disease Mother    Diabetes Father    Heart disease Father    Diabetes Maternal Aunt    Diabetes Maternal Grandmother    Colon cancer Neg Hx    Colon polyps Neg Hx    Esophageal cancer Neg Hx    Rectal cancer Neg Hx    Stomach cancer Neg Hx     Social  History   Socioeconomic History   Marital status: Married    Spouse name: Not on file   Number of children: Not on file   Years of education: Not on file   Highest education level: Not on file  Occupational History   Not on file  Tobacco Use   Smoking status: Every Day    Current packs/day: 0.50    Average packs/day: 0.5 packs/day for 40.0 years (20.0 ttl pk-yrs)    Types: Cigarettes   Smokeless tobacco: Never   Tobacco comments:    smokes 1 pk /wk  Vaping Use   Vaping status: Never Used  Substance and Sexual Activity   Alcohol use: Yes    Alcohol/week: 6.0 standard drinks of alcohol    Types: 6 Glasses of wine per week    Comment: few drinks a week   Drug use: No   Sexual activity: Not on file  Other Topics Concern   Not on file  Social History Narrative   Not on file   Social Drivers of Health   Financial Resource Strain: Not on file  Food Insecurity: Not on file  Transportation Needs: Not on file  Physical Activity: Not on file  Stress: Not on file  Social Connections: Not on file  Intimate Partner Violence: Not on file    Allergies  Allergen Reactions   Keflex [Cephalexin] Other (See Comments)    abd burning  And pain and diarrhea    Current Outpatient Medications  Medication Sig Dispense Refill   ALPRAZolam (XANAX) 0.25 MG tablet Take 1 tablet by mouth as directed.     buPROPion (WELLBUTRIN XL) 150 MG 24 hr tablet Take 150 mg by mouth daily.     doxycycline  (VIBRAMYCIN ) 100 MG capsule Take 100 mg by mouth 2 (two) times daily. (Patient not taking: Reported on 08/22/2023)     fluticasone (FLONASE) 50 MCG/ACT nasal spray Place 2 sprays into both nostrils daily.     LEXAPRO 20 MG tablet Take 30 mg by mouth daily.     Multiple Vitamin (MULTIVITAMIN) tablet Take 1 tablet by mouth daily.     OVER THE COUNTER MEDICATION Take 2 capsules by mouth daily. Vitamind     rosuvastatin (CRESTOR) 10 MG tablet Take 1 tablet by mouth daily.     sildenafil (VIAGRA) 100 MG  tablet Take 1 tablet by mouth as directed.     silodosin (RAPAFLO) 8 MG CAPS capsule Take 8 mg by mouth at bedtime.     tadalafil (CIALIS) 5 MG tablet Take 5 mg by mouth daily as needed for erectile dysfunction.     tiZANidine (ZANAFLEX) 2 MG tablet Take 1 tablet by mouth as directed.     traMADol  (ULTRAM ) 50 MG tablet Take 50 mg by mouth 4 (four) times daily as needed. (Patient not taking: Reported on 08/22/2023)  valsartan (DIOVAN) 160 MG tablet Take 160 mg by mouth daily.     No current facility-administered medications for this visit.    PHYSICAL EXAM Vitals:   05/12/24 1239  BP: (!) 152/84  Pulse: 82  Temp: 98.7 F (37.1 C)  SpO2: 95%  Weight: 209 lb (94.8 kg)  Height: 5' 7 (1.702 m)      Well appearing No distress Regular rate and rhythm Unlabored breathing No pedal pulses palpable. Feet are warm.  PERTINENT LABORATORY AND RADIOLOGIC DATA  Most recent CBC    Latest Ref Rng & Units 10/16/2016    3:59 PM 05/08/2009    2:00 PM 04/30/2009    8:38 PM  CBC  WBC 4.0 - 10.5 K/uL 9.4  7.6    Hemoglobin 13.0 - 17.0 g/dL 85.4  86.5  84.3   Hematocrit 39.0 - 52.0 % 43.1  38.7  46.0   Platelets 150 - 400 K/uL 131  176       Most recent CMP    Latest Ref Rng & Units 10/16/2016    3:59 PM 04/30/2009    8:38 PM  CMP  Glucose 65 - 99 mg/dL 96  890   BUN 6 - 20 mg/dL 11  9   Creatinine 9.38 - 1.24 mg/dL 9.06  0.9   Sodium 864 - 145 mmol/L 135  134   Potassium 3.5 - 5.1 mmol/L 3.8  3.6   Chloride 101 - 111 mmol/L 101  104   CO2 22 - 32 mmol/L 25    Calcium 8.9 - 10.3 mg/dL 9.1    Total Protein 6.5 - 8.1 g/dL 7.4    Total Bilirubin 0.3 - 1.2 mg/dL 0.9    Alkaline Phos 38 - 126 U/L 59    AST 15 - 41 U/L 21    ALT 17 - 63 U/L 22      48 mm abdominal aortic aneurysm on duplex today   +-------+-----------+-----------+------------+------------+  ABI/TBIToday's ABIToday's TBIPrevious ABIPrevious TBI  +-------+-----------+-----------+------------+------------+   Right 0.92       0.68       0.83        0.54          +-------+-----------+-----------+------------+------------+  Left  0.64       0.43       0.64        0.44          +-------+-----------+-----------+------------+------------+    Debby SAILOR. Magda, MD FACS [facility] Phone Number: [phone] 05/12/2024 4:42 PM  Total time spent on preparing this encounter including chart review, data review, collecting history, examining the patient, coordinating care for this established patient, 40 minutes.  Portions of this report may have been transcribed using voice recognition software.  Every effort has been made to ensure accuracy; however, inadvertent computerized transcription errors may still be present.

## 2024-05-12 ENCOUNTER — Ambulatory Visit: Attending: Vascular Surgery | Admitting: Vascular Surgery

## 2024-05-12 ENCOUNTER — Encounter: Payer: Self-pay | Admitting: Vascular Surgery

## 2024-05-12 VITALS — BP 152/84 | HR 82 | Temp 98.7°F | Ht 67.0 in | Wt 209.0 lb

## 2024-05-12 DIAGNOSIS — I70219 Atherosclerosis of native arteries of extremities with intermittent claudication, unspecified extremity: Secondary | ICD-10-CM

## 2024-05-12 DIAGNOSIS — I7143 Infrarenal abdominal aortic aneurysm, without rupture: Secondary | ICD-10-CM | POA: Diagnosis not present

## 2024-05-15 DIAGNOSIS — F4321 Adjustment disorder with depressed mood: Secondary | ICD-10-CM | POA: Diagnosis not present

## 2024-05-25 DIAGNOSIS — M47816 Spondylosis without myelopathy or radiculopathy, lumbar region: Secondary | ICD-10-CM | POA: Diagnosis not present

## 2024-05-29 DIAGNOSIS — F4321 Adjustment disorder with depressed mood: Secondary | ICD-10-CM | POA: Diagnosis not present

## 2024-06-22 ENCOUNTER — Ambulatory Visit: Attending: Surgery | Admitting: Student-PharmD

## 2024-06-22 ENCOUNTER — Encounter: Payer: Self-pay | Admitting: Student-PharmD

## 2024-06-22 ENCOUNTER — Other Ambulatory Visit (HOSPITAL_COMMUNITY): Payer: Self-pay

## 2024-06-22 DIAGNOSIS — I70219 Atherosclerosis of native arteries of extremities with intermittent claudication, unspecified extremity: Secondary | ICD-10-CM | POA: Diagnosis not present

## 2024-06-22 MED ORDER — NICOTINE 21 MG/24HR TD PT24
21.0000 mg | MEDICATED_PATCH | Freq: Every day | TRANSDERMAL | 0 refills | Status: AC
  Filled 2024-06-22: qty 28, 28d supply, fill #0

## 2024-06-22 MED ORDER — ATORVASTATIN CALCIUM 40 MG PO TABS
40.0000 mg | ORAL_TABLET | Freq: Every day | ORAL | 0 refills | Status: AC
  Filled 2024-06-22: qty 90, 90d supply, fill #0

## 2024-06-22 NOTE — Patient Instructions (Addendum)
 Increase atorvastatin  to 40 mg daily.   Keep taking aspirin 81 mg daily.   Use the nicotine  patches - 1 per day. Take off at night if sleep is disrupted. Goal by next visit: 15 cigarettes/day.   Come back Monday morning February 16th for FASTING labs to check your cholesterol. Then come back to see me on Wednesday February 18th to discuss the results and next steps.

## 2024-06-22 NOTE — Progress Notes (Cosign Needed Addendum)
 " [facility] Pharmacist Note  Name: [name]  MRN: [id number]  DOB: [birth year]  Sex: male PCP: Okey Carlin Redbird, MD CPP Referral Provider: Dr. Magda  HISTORY OF PRESENT ILLNESS: [name] is a 64 y.o. 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   Dyslipidemia/ASCVD  Current lipid-lowering medications: atorvastatin  20 mg daily  Previously tried medications/intolerances: rosuvastatin 10 mg daily (myalgias)  Rx affordability and access: no concerns at this time   Current antiplatelets/antithrombotics: aspirin 81 mg daily    Tobacco Abuse Current medications: bupropion 150 mg daily Rx affordability and access: no concerns   Tobacco Use History:   Number of cigarettes per day: 15-20   Does not wake at night to smoke  Triggers to smoke: stress, the habit of smoking    Quit Attempt History:   Longest time ever been tobacco free: 1 day  Methods tried in the past: Varenicline (Chantix) - couldn't sleep, patch Motivators to quit: health, wife wants him to quit Barriers to quit: stress  Current dietary habits: reports eating a healthy diet, avoids junk food, red meat  Past Medical History:  Diagnosis Date   Anxiety    Arthritis    Cataract    removed with surgery   Diverticulosis    HA (headache)    Hyperlipemia    Sleep apnea    Uses CPAP nightly   Tubular adenoma of colon    Past Surgical History:  Procedure Laterality Date   ADENOIDECTOMY     ANKLE ARTHROSCOPY Left    x2   CATARACT EXTRACTION Bilateral    COLONOSCOPY     polyps   JOINT REPLACEMENT Left    KNEE ARTHROSCOPY Left    x2   MOUTH SURGERY     NASAL SEPTUM SURGERY     right leg surgery     fx fib/tib with steel rod from injury    SALIVARY GLAND SURGERY     TONSILLECTOMY     TYMPANOSTOMY     WISDOM TOOTH EXTRACTION     Family History  Problem Relation Age  of Onset   Cancer - Other Sister    Cancer - Other Maternal Grandfather    Heart disease Mother    Diabetes Father    Heart disease Father    Diabetes Maternal Aunt    Diabetes Maternal Grandmother    Colon cancer Neg Hx    Colon polyps Neg Hx    Esophageal cancer Neg Hx    Rectal cancer Neg Hx    Stomach cancer Neg Hx    LABS: No results found for: CHOL, HDL, LDLCALC, LDLDIRECT, TRIG, CHOLHDL  Lab Results  Component Value Date   CREATININE 0.93 10/16/2016   BUN 11 10/16/2016   NA 135 10/16/2016   K 3.8 10/16/2016   CL 101 10/16/2016   CO2 25 10/16/2016   CrCl cannot be calculated (Patient's most recent lab result is older than the maximum 21 days allowed.).      Component Value Date/Time   PROT 7.4 10/16/2016 1559   ALBUMIN 4.3 10/16/2016 1559   AST 21 10/16/2016 1559   ALT 22 10/16/2016 1559   ALKPHOS 59 10/16/2016 1559   BILITOT 0.9 10/16/2016 1559    No results found for: HGBA1C   Labs from [facility]:  Lipid Panel Reviewed date:02/05/2024 05:25:07 PM Interpretation:Better Performing Lab: Notes/Report: Testing Performed at: [facility], [address], [address], [place], [place] [address]  Cholesterol 198 <200 mg/dL    CHOL/HDL 4.3 7.9-5.9 Ratio    HDLD 46 30-70 mg/dL Values below 40 mg/dL indicate increased risk factor  Triglyceride 195 0-199 mg/dL    NHDL 848 9-870 mg/dL Range dependent upon risk factors.  LDL Chol Calc (NIH) 117 0-99 mg/dL     ASSESSMENT & PLAN:  Dyslipidemia LDL above goal <55 mg/dL.   Increase atorvastatin  to 40 mg daily.  Counseled patient on treatment, including efficacy, dosing, administration, possible adverse effects, and anticipated cost.  Reviewed long-term complications of uncontrolled cholesterol.  Reviewed goals for cholesterol readings with patient.  Reviewed dietary and lifestyle modifications to improve cholesterol.  Repeat lipid panel in 4-12 weeks. Will likely require PCSK9i to reach LDL goal, which patient  is open to.   Antiplatelets/Antithrombotics Patient with no recent revascularization.  Continue aspirin 81 mg daily.  Counseled patient on treatment, including efficacy, dosing, administration, possible adverse effects, and anticipated cost.    Tobacco Abuse Initiated nicotine  patch. Patient counseled on purpose, proper use, and potential adverse effects including skin irritation, vivid dreams, and sleep disturbances, and to remove the patch at night if any sleep disturbances occur. Patch schedule for >10 cigarettes daily: Weeks 1-6: one 21 mg patch daily, Weeks 7-8: one 14 mg patch daily, Weeks 9-10: one 7 mg patch daily. Patch schedule for <10 cigarettes daily: Weeks 1-6: one nicotine  patch (14 mg) daily, Weeks 7-8: one nicotine  patch (7 mg) daily. Schedule can be lengthened as needed.  Counseled patient on treatment, including efficacy, dosing, administration, possible adverse effects, and anticipated cost.  Provided motivational interviewing to assess tobacco use and strategies for reduction.  Provided information on 1-800-QUIT NOW support program.   Recommend annual influenza and COVID vaccines.   Follow up: 4-6 weeks after repeat lipid panel  Lum Herald, PharmD, BCACP, CPP [facility] [phone]  I have evaluated the patient's chart/imaging and refer this patient to the Clinical Pharmacist Practitioner for medication management. I have reviewed the CPP's documentation and agree with her assessment and plan. I was immediately available during the visit for questions and collaboration.   Malvina New, MD  "

## 2024-07-15 ENCOUNTER — Ambulatory Visit: Payer: Self-pay | Admitting: Student-PharmD

## 2024-07-15 LAB — LIPID PANEL
Chol/HDL Ratio: 3.5 ratio (ref 0.0–5.0)
Cholesterol, Total: 185 mg/dL (ref 100–199)
HDL: 53 mg/dL
LDL Chol Calc (NIH): 102 mg/dL — ABNORMAL HIGH (ref 0–99)
Triglycerides: 176 mg/dL — ABNORMAL HIGH (ref 0–149)
VLDL Cholesterol Cal: 30 mg/dL (ref 5–40)

## 2024-07-15 LAB — HEPATIC FUNCTION PANEL
ALT: 24 [IU]/L (ref 0–44)
AST: 20 [IU]/L (ref 0–40)
Albumin: 4.7 g/dL (ref 3.9–4.9)
Alkaline Phosphatase: 59 [IU]/L (ref 47–123)
Bilirubin Total: 0.8 mg/dL (ref 0.0–1.2)
Bilirubin, Direct: 0.27 mg/dL (ref 0.00–0.40)
Total Protein: 7.4 g/dL (ref 6.0–8.5)

## 2024-07-16 ENCOUNTER — Other Ambulatory Visit (HOSPITAL_COMMUNITY): Payer: Self-pay

## 2024-07-16 ENCOUNTER — Telehealth: Payer: Self-pay | Admitting: Pharmacy Technician

## 2024-07-16 NOTE — Telephone Encounter (Signed)
 Pharmacy Patient Advocate Encounter   Received notification from Physician's Office that prior authorization for repatha is required/requested.   Insurance verification completed.   The patient is insured through [facility].   Per test claim: PA required; PA submitted to above mentioned insurance via Latent Key/confirmation #/EOC [id number] Status is pending

## 2024-07-17 ENCOUNTER — Other Ambulatory Visit (HOSPITAL_COMMUNITY): Payer: Self-pay

## 2024-07-17 NOTE — Telephone Encounter (Signed)
 Pharmacy Patient Advocate Encounter  Received notification from [facility] that Prior Authorization for Repatha has been APPROVED from 07/16/24 to 07/16/25. Ran test claim, Copay is $528.76- one month. This test claim was processed through [facility]- copay amounts may vary at other pharmacies due to pharmacy/plan contracts, or as the patient moves through the different stages of their insurance plan.   PA #/Case ID/Reference #: [id number]

## 2024-07-29 ENCOUNTER — Ambulatory Visit: Admitting: Student-PharmD
